# Patient Record
Sex: Male | Born: 1950 | Race: White | Hispanic: No | Marital: Single | Smoking: Never smoker
Health system: Southern US, Community
[De-identification: ages and names within clinical notes are randomized; demographics above are authoritative.]

## PROBLEM LIST (undated history)

## (undated) DIAGNOSIS — K219 Gastro-esophageal reflux disease without esophagitis: Secondary | ICD-10-CM

## (undated) DIAGNOSIS — S065X9A Traumatic subdural hemorrhage with loss of consciousness of unspecified duration, initial encounter: Secondary | ICD-10-CM

## (undated) DIAGNOSIS — S065XAA Traumatic subdural hemorrhage with loss of consciousness status unknown, initial encounter: Secondary | ICD-10-CM

## (undated) HISTORY — PX: HERNIA REPAIR: SHX51

---

## 1999-12-22 ENCOUNTER — Emergency Department (HOSPITAL_COMMUNITY): Admission: EM | Admit: 1999-12-22 | Discharge: 1999-12-22 | Payer: Self-pay | Admitting: Internal Medicine

## 2000-08-23 ENCOUNTER — Emergency Department (HOSPITAL_COMMUNITY): Admission: EM | Admit: 2000-08-23 | Discharge: 2000-08-23 | Payer: Self-pay | Admitting: Emergency Medicine

## 2000-10-05 ENCOUNTER — Ambulatory Visit (HOSPITAL_COMMUNITY): Admission: EM | Admit: 2000-10-05 | Discharge: 2000-10-06 | Payer: Self-pay | Admitting: Emergency Medicine

## 2000-10-17 ENCOUNTER — Ambulatory Visit (HOSPITAL_COMMUNITY): Admission: RE | Admit: 2000-10-17 | Discharge: 2000-10-17 | Payer: Self-pay | Admitting: Gastroenterology

## 2000-10-17 ENCOUNTER — Encounter: Payer: Self-pay | Admitting: Gastroenterology

## 2001-01-02 ENCOUNTER — Encounter: Payer: Self-pay | Admitting: Gastroenterology

## 2001-01-02 ENCOUNTER — Ambulatory Visit (HOSPITAL_COMMUNITY): Admission: RE | Admit: 2001-01-02 | Discharge: 2001-01-02 | Payer: Self-pay | Admitting: Gastroenterology

## 2001-03-06 ENCOUNTER — Ambulatory Visit (HOSPITAL_COMMUNITY): Admission: RE | Admit: 2001-03-06 | Discharge: 2001-03-06 | Payer: Self-pay | Admitting: Gastroenterology

## 2001-03-06 ENCOUNTER — Encounter: Payer: Self-pay | Admitting: Gastroenterology

## 2001-08-14 ENCOUNTER — Ambulatory Visit (HOSPITAL_COMMUNITY): Admission: RE | Admit: 2001-08-14 | Discharge: 2001-08-14 | Payer: Self-pay | Admitting: Gastroenterology

## 2004-08-21 ENCOUNTER — Encounter: Admission: RE | Admit: 2004-08-21 | Discharge: 2004-08-21 | Payer: Self-pay | Admitting: General Surgery

## 2004-08-22 ENCOUNTER — Ambulatory Visit (HOSPITAL_COMMUNITY): Admission: RE | Admit: 2004-08-22 | Discharge: 2004-08-22 | Payer: Self-pay | Admitting: General Surgery

## 2004-08-22 ENCOUNTER — Ambulatory Visit (HOSPITAL_BASED_OUTPATIENT_CLINIC_OR_DEPARTMENT_OTHER): Admission: RE | Admit: 2004-08-22 | Discharge: 2004-08-22 | Payer: Self-pay | Admitting: General Surgery

## 2015-06-06 ENCOUNTER — Encounter (HOSPITAL_BASED_OUTPATIENT_CLINIC_OR_DEPARTMENT_OTHER): Payer: Self-pay | Admitting: Emergency Medicine

## 2015-06-06 ENCOUNTER — Emergency Department (HOSPITAL_BASED_OUTPATIENT_CLINIC_OR_DEPARTMENT_OTHER)
Admission: EM | Admit: 2015-06-06 | Discharge: 2015-06-06 | Disposition: A | Discharge status: Home / Self Care | Payer: No Typology Code available for payment source | Attending: Emergency Medicine | Admitting: Emergency Medicine

## 2015-06-06 ENCOUNTER — Emergency Department (HOSPITAL_BASED_OUTPATIENT_CLINIC_OR_DEPARTMENT_OTHER): Payer: No Typology Code available for payment source

## 2015-06-06 ENCOUNTER — Other Ambulatory Visit: Payer: Self-pay

## 2015-06-06 DIAGNOSIS — Y9389 Activity, other specified: Secondary | ICD-10-CM | POA: Insufficient documentation

## 2015-06-06 DIAGNOSIS — R51 Headache: Secondary | ICD-10-CM | POA: Insufficient documentation

## 2015-06-06 DIAGNOSIS — Y998 Other external cause status: Secondary | ICD-10-CM | POA: Insufficient documentation

## 2015-06-06 DIAGNOSIS — W208XXA Other cause of strike by thrown, projected or falling object, initial encounter: Secondary | ICD-10-CM | POA: Diagnosis not present

## 2015-06-06 DIAGNOSIS — S60222A Contusion of left hand, initial encounter: Secondary | ICD-10-CM | POA: Diagnosis not present

## 2015-06-06 DIAGNOSIS — S6992XA Unspecified injury of left wrist, hand and finger(s), initial encounter: Secondary | ICD-10-CM | POA: Diagnosis present

## 2015-06-06 DIAGNOSIS — Z79899 Other long term (current) drug therapy: Secondary | ICD-10-CM | POA: Insufficient documentation

## 2015-06-06 DIAGNOSIS — Y9289 Other specified places as the place of occurrence of the external cause: Secondary | ICD-10-CM | POA: Insufficient documentation

## 2015-06-06 DIAGNOSIS — S065X9A Traumatic subdural hemorrhage with loss of consciousness of unspecified duration, initial encounter: Secondary | ICD-10-CM

## 2015-06-06 DIAGNOSIS — S065XAA Traumatic subdural hemorrhage with loss of consciousness status unknown, initial encounter: Secondary | ICD-10-CM

## 2015-06-06 DIAGNOSIS — R200 Anesthesia of skin: Secondary | ICD-10-CM | POA: Diagnosis not present

## 2015-06-06 DIAGNOSIS — K219 Gastro-esophageal reflux disease without esophagitis: Secondary | ICD-10-CM | POA: Insufficient documentation

## 2015-06-06 HISTORY — DX: Gastro-esophageal reflux disease without esophagitis: K21.9

## 2015-06-06 LAB — CBC WITH DIFFERENTIAL/PLATELET
Basophils Absolute: 0 10*3/uL (ref 0.0–0.1)
Basophils Relative: 0 %
EOS ABS: 0.3 10*3/uL (ref 0.0–0.7)
EOS PCT: 3 %
HCT: 43.8 % (ref 39.0–52.0)
Hemoglobin: 15.3 g/dL (ref 13.0–17.0)
LYMPHS ABS: 1.4 10*3/uL (ref 0.7–4.0)
LYMPHS PCT: 14 %
MCH: 31.2 pg (ref 26.0–34.0)
MCHC: 34.9 g/dL (ref 30.0–36.0)
MCV: 89.4 fL (ref 78.0–100.0)
MONO ABS: 0.9 10*3/uL (ref 0.1–1.0)
Monocytes Relative: 9 %
Neutro Abs: 7.2 10*3/uL (ref 1.7–7.7)
Neutrophils Relative %: 74 %
PLATELETS: 257 10*3/uL (ref 150–400)
RBC: 4.9 MIL/uL (ref 4.22–5.81)
RDW: 13.1 % (ref 11.5–15.5)
WBC: 9.8 10*3/uL (ref 4.0–10.5)

## 2015-06-06 LAB — BASIC METABOLIC PANEL
Anion gap: 8 (ref 5–15)
BUN: 16 mg/dL (ref 6–20)
CHLORIDE: 105 mmol/L (ref 101–111)
CO2: 25 mmol/L (ref 22–32)
CREATININE: 1.04 mg/dL (ref 0.61–1.24)
Calcium: 8.9 mg/dL (ref 8.9–10.3)
GFR calc Af Amer: >60 mL/min (ref >60)
GLUCOSE: 117 mg/dL — AB (ref 65–99)
POTASSIUM: 4 mmol/L (ref 3.5–5.1)
Sodium: 138 mmol/L (ref 135–145)

## 2015-06-06 LAB — TROPONIN I: Troponin I: <0.03 ng/mL (ref <0.031)

## 2015-06-06 LAB — PROTIME-INR
INR: 1 (ref 0.00–1.49)
Prothrombin Time: 13.4 seconds (ref 11.6–15.2)

## 2015-06-06 NOTE — ED Notes (Signed)
Pt resting.  Pt placed on cardiac monitor. Informed patient he will be transferred to [place].  Pt verbalized  understanding.  Had this RN speak with step daughter Earnest Bailey

## 2015-06-06 NOTE — ED Provider Notes (Signed)
CSN: [id number]     Arrival date & time 06/06/15  1650 History   First MD Initiated Contact with Patient 06/06/15 1659     Chief Complaint  Patient presents with  . Hand Problem     (Consider location/radiation/quality/duration/timing/severity/associated sxs/prior Treatment) HPI Comments: Pt comes in with c/o problems with grip strength in his left hand and numbness to the left hand. Denies any injury. States that he started with a right headache 1 week ago. Denies vision or speech changes. He states that he has dropped his coffee cup a couple of times in the last week. He states that he is having numbness in his left upper gums. Denies problems swallowing.  The history is provided by the patient. No language interpreter was used.    Past Medical History  Diagnosis Date  . Acid reflux    Past Surgical History  Procedure Laterality Date  . Hernia repair     History reviewed. No pertinent family history. Social History  Substance Use Topics  . Smoking status: Never Smoker   . Smokeless tobacco: None  . Alcohol Use: No     Comment: occ    Review of Systems  All other systems reviewed and are negative.     Allergies  Review of patient's allergies indicates no known allergies.  Home Medications   Prior to Admission medications   Medication Sig Start Date End Date Taking? Authorizing Provider  omeprazole (PRILOSEC) 40 MG capsule Take 40 mg by mouth daily.   Yes Historical Provider, MD   BP 150/83 mmHg  Pulse 74  Temp(Src) 98.9 F (37.2 C) (Oral)  Resp 18  Ht 5\' 11"  (1.803 m)  Wt 86.183 kg  BMI 26.51 kg/m2  SpO2 100% Physical Exam  Constitutional: He is oriented to person, place, and time. He appears well-developed and well-nourished.  Cardiovascular: Normal rate and regular rhythm.   Pulmonary/Chest: Effort normal and breath sounds normal.  Abdominal: Soft. Bowel sounds are normal. There is no tenderness.  Musculoskeletal: Normal range of motion.   Neurological: He is alert and oriented to person, place, and time. He exhibits normal muscle tone. Coordination normal.  Grip strength is equal bilaterally. Good sensation to bilateral upper extremities. negative  For pronator drift. normal finger to nose. No facial drop  Skin: Skin is warm and dry.  Psychiatric: He has a normal mood and affect.  Nursing note and vitals reviewed.   ED Course  Procedures (including critical care time) Labs Review Labs Reviewed  BASIC METABOLIC PANEL - Abnormal; Notable for the following:    Glucose, Bld 117 (*)    All other components within normal limits  CBC WITH DIFFERENTIAL/PLATELET  TROPONIN I  PROTIME-INR    Imaging Review Dg Chest 2 View  06/06/2015  CLINICAL DATA:  Headache for 2 weeks, left upper extremity numbness radiating to left hand for 2 days EXAM: CHEST  2 VIEW COMPARISON:  08/21/2004 FINDINGS: The heart size and mediastinal contours are within normal limits. Both lungs are clear. The visualized skeletal structures are unremarkable. IMPRESSION: No active cardiopulmonary disease. Electronically Signed   By: Skipper Cliche M.D.   On: 06/06/2015 18:05   Ct Head Wo Contrast  06/06/2015  CLINICAL DATA:  Numbness of the left hand. EXAM: CT HEAD WITHOUT CONTRAST TECHNIQUE: Contiguous axial images were obtained from the base of the skull through the vertex without intravenous contrast. COMPARISON:  None. FINDINGS: There is an extra-axial fluid collection along the right cerebral convexity primarily low density with  a thin rim of hyperdensity most concerning for a subdural hematoma. The collection measures 13 mm in thickness with a majority of the collection measuring CSF attenuation and the hyperdense area measuring approximately 5 mm in greatest thickness. There is no midline shift. There is mild effacement of the right cerebral sulci. There is no evidence of mass effect or midline shift. There is no evidence of a space-occupying lesion. There is  no evidence of a cortical-based area of acute infarction. The ventricles and sulci are appropriate for the patient's age. The basal cisterns are patent. Visualized portions of the orbits are unremarkable. The visualized portions of the paranasal sinuses and mastoid air cells are unremarkable. The osseous structures are unremarkable. IMPRESSION: 1. Extra-axial fluid collection along the right cerebral convexity primarily low density with a thin rim of hyperdensity along the right cerebral hemisphere most concerning for an acute on subacute subdural hematoma. Critical Value/emergent results were called by telephone at the time of interpretation on 06/06/2015 at 6:18 pm to Dr. Glendell Docker , who verbally acknowledged these results. Electronically Signed   By: Kathreen Devoid   On: 06/06/2015 18:19   I have personally reviewed and evaluated these images and lab results as part of my medical decision-making.   EKG Interpretation None      MDM   Final diagnoses:  Subdural hematoma (HCC)    Discussed findings with Dr. Kathyrn Sheriff with nuerosurgery and pt is to be seen by him in the morning. Discussed findings with pt and return precautions. Pt in agreement with plan. Pt is ambulatory without any problem    Glendell Docker, NP 06/06/15 [place], MD 06/06/15 [phone]

## 2015-06-06 NOTE — ED Notes (Signed)
Patient reports that he started to have some numbness to his left hand starting yesterday. The patient states that his left hand is numb and he is having trouble using his fingers. The patient reports that he has this intermittently when trying to use the clutch on his motorcycle

## 2015-06-06 NOTE — Discharge Instructions (Signed)
Take tylenol if you need pain medication. Don't take bc:if you develop worsening symptoms between now and the morning then you need to go to [place]. You can follow up at his office at 9 in the morning Subdural Hematoma A subdural hematoma is a collection of blood between the brain and its tough outermost membrane covering (the dura). Blood clots that form in this area push down on the brain and cause irritation. A subdural hematoma may cause parts of the brain to stop working and eventually cause death.  CAUSES A subdural hematoma is caused by bleeding from a ruptured blood vessel (hemorrhage). The bleeding results from trauma to the head, such as from a fall or motor vehicle accident. There are two types of subdural hemorrhages:  Acute. This type develops shortly after a serious blow to the head and causes blood to collect very quickly. If not diagnosed and treated promptly, severe brain injury or death can occur.  Chronic. This is when bleeding develops more slowly, over weeks or months. RISK FACTORS People at risk for subdural hematoma include older persons, infants, and alcoholics. SYMPTOMS An acute subdural hemorrhage develops over minutes to hours. Symptoms can include:  Temporary loss of consciousness.  Weakness of arms or legs on one side of the body.  Changes in vision or speech.  A severe headache.  Seizures.  Nausea and vomiting.  Increased sleepiness. A chronic subdural hemorrhage develops over weeks to months. Symptoms may develop slowly and produce less noticeable problems or changes. Symptoms include:  A mild headache.  A change in personality.  Loss of balance or difficulty walking.  Weakness, numbness, or tingling in the arms or legs.  Nausea or vomiting.  Memory loss.  Double vision.  Increased sleepiness. DIAGNOSIS Your health care provider will perform a thorough physical and neurological exam. A CT scan or MRI may also be done. If there is blood  on the scan, its color will help your health care provider determine how long the hemorrhage has been there. TREATMENT If the cause is an acute subdural hemorrhage, immediate treatment is needed. In many cases an emergency surgery is performed to drain accumulated blood or to remove the blood clot. Sometimes steroid or diuretic medicines or controlled breathing through a ventilator is needed to decrease pressure in the brain. This is especially true if there is any swelling of the brain. If the cause is a chronic subdural hemorrhage, treatment depends on a variety of factors. Sometimes no treatment is needed. If the subdural hematoma is small and causes minimal or no symptoms, you may be treated with bed rest, medicines, and observation. If the hemorrhage is large or if you have neurological symptoms, an emergency surgery is usually needed to remove the blood clot. People who develop a subdural hemorrhage are at risk of developing seizures, even after the subdural hematoma has been treated. You may be prescribed an anti-seizure (anticonvulsant) medicine for a year or longer. HOME CARE INSTRUCTIONS  Only take medicines as directed by your health care provider.  Rest if directed by your health care provider.  Keep all follow-up appointments with your health care provider.  If you play a contact sport such as football, hockey or soccer and you experienced a significant head injury, allow enough time for healing (up to 15 days) before you start playing again. A repeated injury that occurs during this fragile repair period is likely to result in hemorrhage. This is called the second impact syndrome. SEEK IMMEDIATE MEDICAL CARE IF:  You fall or experience minor trauma to your head and you are taking blood thinners. If you are on any blood thinners even a very small injury can cause a subdural hematoma. You should not hesitate to seek medical attention regardless of how minor you think your symptoms  are.  You experience a head injury and have:  Drowsiness or a decrease in alertness.  Confusion or forgetfulness.  Slurred speech.  Irrational or aggressive behavior.  Numbness or paralysis in any part of the body.  A feeling of being sick to your stomach (nauseous) or you throw up (vomit).  Difficulty walking or poor coordination.  Double vision.  Seizures.  A bleeding disorder.  A history of heavy alcohol use.  Clear fluid draining from your nose or ears.  Personality changes.  Difficulty thinking.  Worsening symptoms. MAKE SURE YOU:  Understand these instructions.  Will watch your condition.  Will get help right away if you are not doing well or get worse. [place] of Neurological Disorders and Stroke: MasterBoxes.it American Association of Neurological Surgeons: www.neurosurgerytoday.org American Academy of Neurology (AAN): http://keith.biz/ Brain Injury Association of America: www.biausa.org   This information is not intended to replace advice given to you by your health care provider. Make sure you discuss any questions you have with your health care provider.   Document Released: 05/11/2004 Document Revised: 04/14/2013 Document Reviewed: 12/25/2012 Elsevier Interactive Patient Education [employer].

## 2015-06-07 ENCOUNTER — Other Ambulatory Visit: Payer: Self-pay | Admitting: Neurosurgery

## 2015-06-08 ENCOUNTER — Encounter (HOSPITAL_COMMUNITY): Payer: Self-pay | Admitting: *Deleted

## 2015-06-09 ENCOUNTER — Encounter (HOSPITAL_COMMUNITY)
Admission: RE | Disposition: A | Discharge status: Home / Self Care | Payer: Self-pay | Source: Ambulatory Visit | Attending: Neurosurgery

## 2015-06-09 ENCOUNTER — Inpatient Hospital Stay (HOSPITAL_COMMUNITY)
Admission: RE | Admit: 2015-06-09 | Discharge: 2015-06-13 | DRG: 027 | Disposition: A | Discharge status: Home / Self Care | Payer: No Typology Code available for payment source | Source: Ambulatory Visit | Attending: Neurosurgery | Admitting: Neurosurgery

## 2015-06-09 ENCOUNTER — Inpatient Hospital Stay (HOSPITAL_COMMUNITY): Payer: No Typology Code available for payment source | Admitting: Anesthesiology

## 2015-06-09 ENCOUNTER — Encounter (HOSPITAL_COMMUNITY): Payer: Self-pay | Admitting: *Deleted

## 2015-06-09 DIAGNOSIS — I6203 Nontraumatic chronic subdural hemorrhage: Secondary | ICD-10-CM | POA: Diagnosis present

## 2015-06-09 DIAGNOSIS — K219 Gastro-esophageal reflux disease without esophagitis: Secondary | ICD-10-CM | POA: Diagnosis present

## 2015-06-09 DIAGNOSIS — Z79899 Other long term (current) drug therapy: Secondary | ICD-10-CM | POA: Diagnosis not present

## 2015-06-09 HISTORY — DX: Traumatic subdural hemorrhage with loss of consciousness status unknown, initial encounter: S06.5XAA

## 2015-06-09 HISTORY — PX: BURR HOLE: SHX908

## 2015-06-09 HISTORY — DX: Traumatic subdural hemorrhage with loss of consciousness of unspecified duration, initial encounter: S06.5X9A

## 2015-06-09 LAB — MRSA PCR SCREENING: MRSA BY PCR: NEGATIVE

## 2015-06-09 SURGERY — CREATION, CRANIAL BURR HOLE
Anesthesia: General | Laterality: Right

## 2015-06-09 MED ORDER — ROCURONIUM BROMIDE 50 MG/5ML IV SOLN
INTRAVENOUS | Status: AC
  Filled 2015-06-09: qty 1

## 2015-06-09 MED ORDER — ONDANSETRON HCL 4 MG PO TABS: 4.0000 mg | ORAL_TABLET | ORAL | Status: DC | PRN

## 2015-06-09 MED ORDER — ONDANSETRON HCL 4 MG/2ML IJ SOLN
INTRAMUSCULAR | Status: DC | PRN
  Administered 2015-06-09: 4 mg via INTRAVENOUS

## 2015-06-09 MED ORDER — PANTOPRAZOLE SODIUM 40 MG PO TBEC
80.0000 mg | DELAYED_RELEASE_TABLET | Freq: Every day | ORAL | Status: DC
  Administered 2015-06-10 – 2015-06-13 (×4): 80 mg via ORAL
  Filled 2015-06-09 (×4): qty 2

## 2015-06-09 MED ORDER — SUCCINYLCHOLINE CHLORIDE 20 MG/ML IJ SOLN
INTRAMUSCULAR | Status: AC
  Filled 2015-06-09: qty 1

## 2015-06-09 MED ORDER — SENNA 8.6 MG PO TABS
1.0000 | ORAL_TABLET | Freq: Two times a day (BID) | ORAL | Status: DC
Start: 2015-06-09 — End: 2015-06-13
  Administered 2015-06-09 – 2015-06-12 (×8): 8.6 mg via ORAL
  Filled 2015-06-09 (×8): qty 1

## 2015-06-09 MED ORDER — BUPIVACAINE HCL (PF) 0.5 % IJ SOLN
INTRAMUSCULAR | Status: DC | PRN
  Administered 2015-06-09: 5 mL

## 2015-06-09 MED ORDER — SUGAMMADEX SODIUM 200 MG/2ML IV SOLN
INTRAVENOUS | Status: DC | PRN
  Administered 2015-06-09: 200 mg via INTRAVENOUS

## 2015-06-09 MED ORDER — GELATIN ABSORBABLE MT POWD
OROMUCOSAL | Status: DC | PRN
  Administered 2015-06-09: 07:00:00 via TOPICAL

## 2015-06-09 MED ORDER — PHENYLEPHRINE 40 MCG/ML (10ML) SYRINGE FOR IV PUSH (FOR BLOOD PRESSURE SUPPORT)
PREFILLED_SYRINGE | INTRAVENOUS | Status: AC
  Filled 2015-06-09: qty 10

## 2015-06-09 MED ORDER — SODIUM CHLORIDE 0.9 % IV SOLN
INTRAVENOUS | Status: DC
  Administered 2015-06-09 (×2): via INTRAVENOUS

## 2015-06-09 MED ORDER — FENTANYL CITRATE (PF) 100 MCG/2ML IJ SOLN: 25.0000 ug | INTRAMUSCULAR | Status: DC | PRN

## 2015-06-09 MED ORDER — ADULT MULTIVITAMIN W/MINERALS CH
1.0000 | ORAL_TABLET | Freq: Every day | ORAL | Status: DC
  Administered 2015-06-10 – 2015-06-13 (×4): 1 via ORAL
  Filled 2015-06-09 (×4): qty 1

## 2015-06-09 MED ORDER — LIDOCAINE-EPINEPHRINE 1 %-1:100000 IJ SOLN
INTRAMUSCULAR | Status: DC | PRN
  Administered 2015-06-09: 5 mL

## 2015-06-09 MED ORDER — PROPOFOL 10 MG/ML IV BOLUS
INTRAVENOUS | Status: AC
  Filled 2015-06-09: qty 20

## 2015-06-09 MED ORDER — CEFAZOLIN SODIUM 1-5 GM-% IV SOLN
1.0000 g | Freq: Three times a day (TID) | INTRAVENOUS | Status: AC
  Administered 2015-06-09 (×2): 1 g via INTRAVENOUS
  Filled 2015-06-09 (×2): qty 50

## 2015-06-09 MED ORDER — PROMETHAZINE HCL 25 MG PO TABS: 12.5000 mg | ORAL_TABLET | ORAL | Status: DC | PRN

## 2015-06-09 MED ORDER — SUGAMMADEX SODIUM 200 MG/2ML IV SOLN
INTRAVENOUS | Status: AC
  Filled 2015-06-09: qty 2

## 2015-06-09 MED ORDER — ONDANSETRON HCL 4 MG/2ML IJ SOLN: 4.0000 mg | INTRAMUSCULAR | Status: DC | PRN

## 2015-06-09 MED ORDER — FENTANYL CITRATE (PF) 250 MCG/5ML IJ SOLN
INTRAMUSCULAR | Status: AC
  Filled 2015-06-09: qty 5

## 2015-06-09 MED ORDER — ONDANSETRON HCL 4 MG/2ML IJ SOLN
INTRAMUSCULAR | Status: AC
  Filled 2015-06-09: qty 2

## 2015-06-09 MED ORDER — LABETALOL HCL 5 MG/ML IV SOLN: 10.0000 mg | INTRAVENOUS | Status: DC | PRN

## 2015-06-09 MED ORDER — ROCURONIUM BROMIDE 100 MG/10ML IV SOLN
INTRAVENOUS | Status: DC | PRN
  Administered 2015-06-09: 50 mg via INTRAVENOUS

## 2015-06-09 MED ORDER — HYDROCODONE-ACETAMINOPHEN 5-325 MG PO TABS
1.0000 | ORAL_TABLET | ORAL | Status: DC | PRN
  Administered 2015-06-09: 1 via ORAL
  Filled 2015-06-09 (×2): qty 1

## 2015-06-09 MED ORDER — EPHEDRINE SULFATE 50 MG/ML IJ SOLN
INTRAMUSCULAR | Status: AC
  Filled 2015-06-09: qty 1

## 2015-06-09 MED ORDER — BISACODYL 10 MG RE SUPP: 10.0000 mg | Freq: Every day | RECTAL | Status: DC | PRN

## 2015-06-09 MED ORDER — HYDROCODONE-ACETAMINOPHEN 5-325 MG PO TABS
1.0000 | ORAL_TABLET | ORAL | Status: DC | PRN
  Administered 2015-06-09: 2 via ORAL
  Administered 2015-06-09 – 2015-06-12 (×11): 1 via ORAL
  Filled 2015-06-09 (×11): qty 1

## 2015-06-09 MED ORDER — FENTANYL CITRATE (PF) 100 MCG/2ML IJ SOLN
INTRAMUSCULAR | Status: DC | PRN
  Administered 2015-06-09: 150 ug via INTRAVENOUS

## 2015-06-09 MED ORDER — BACITRACIN ZINC 500 UNIT/GM EX OINT
TOPICAL_OINTMENT | CUTANEOUS | Status: DC | PRN
  Administered 2015-06-09: 1 via TOPICAL

## 2015-06-09 MED ORDER — MIDAZOLAM HCL 2 MG/2ML IJ SOLN
INTRAMUSCULAR | Status: AC
  Filled 2015-06-09: qty 2

## 2015-06-09 MED ORDER — SODIUM CHLORIDE 0.9 % IR SOLN
Status: DC | PRN
  Administered 2015-06-09: 07:00:00

## 2015-06-09 MED ORDER — LIDOCAINE HCL (CARDIAC) 20 MG/ML IV SOLN
INTRAVENOUS | Status: DC | PRN
  Administered 2015-06-09: 100 mg via INTRAVENOUS

## 2015-06-09 MED ORDER — SODIUM CHLORIDE 0.9 % IJ SOLN
INTRAMUSCULAR | Status: AC
  Filled 2015-06-09: qty 10

## 2015-06-09 MED ORDER — CEFAZOLIN SODIUM-DEXTROSE 2-3 GM-% IV SOLR
INTRAVENOUS | Status: DC | PRN
  Administered 2015-06-09: 2 g via INTRAVENOUS

## 2015-06-09 MED ORDER — MIDAZOLAM HCL 5 MG/5ML IJ SOLN
INTRAMUSCULAR | Status: DC | PRN
  Administered 2015-06-09: 2 mg via INTRAVENOUS

## 2015-06-09 MED ORDER — PROPOFOL 10 MG/ML IV BOLUS
INTRAVENOUS | Status: DC | PRN
  Administered 2015-06-09: 150 mg via INTRAVENOUS

## 2015-06-09 MED ORDER — THROMBIN 20000 UNITS EX SOLR
CUTANEOUS | Status: DC | PRN
  Administered 2015-06-09: 07:00:00 via TOPICAL

## 2015-06-09 MED ORDER — 0.9 % SODIUM CHLORIDE (POUR BTL) OPTIME
TOPICAL | Status: DC | PRN
  Administered 2015-06-09: 1000 mL

## 2015-06-09 MED ORDER — DOCUSATE SODIUM 100 MG PO CAPS
100.0000 mg | ORAL_CAPSULE | Freq: Two times a day (BID) | ORAL | Status: DC
  Administered 2015-06-09 – 2015-06-12 (×8): 100 mg via ORAL
  Filled 2015-06-09 (×8): qty 1

## 2015-06-09 MED ORDER — LEVETIRACETAM 500 MG PO TABS
500.0000 mg | ORAL_TABLET | Freq: Two times a day (BID) | ORAL | Status: DC
  Administered 2015-06-09 – 2015-06-13 (×9): 500 mg via ORAL
  Filled 2015-06-09 (×9): qty 1

## 2015-06-09 MED ORDER — LIDOCAINE HCL (CARDIAC) 20 MG/ML IV SOLN
INTRAVENOUS | Status: AC
  Filled 2015-06-09: qty 5

## 2015-06-09 MED ORDER — LACTATED RINGERS IV SOLN
INTRAVENOUS | Status: DC | PRN
  Administered 2015-06-09 (×2): via INTRAVENOUS

## 2015-06-09 MED ORDER — PANTOPRAZOLE SODIUM 40 MG PO TBEC: 80.0000 mg | DELAYED_RELEASE_TABLET | Freq: Every day | ORAL | Status: DC

## 2015-06-09 MED ORDER — HEMOSTATIC AGENTS (NO CHARGE) OPTIME
TOPICAL | Status: DC | PRN
  Administered 2015-06-09: 1 via TOPICAL

## 2015-06-09 MED ORDER — PROMETHAZINE HCL 25 MG/ML IJ SOLN: 6.2500 mg | INTRAMUSCULAR | Status: DC | PRN

## 2015-06-09 SURGICAL SUPPLY — 73 items
BATTERY IQ STERILE (MISCELLANEOUS) ×3 IMPLANT
BENZOIN TINCTURE PRP APPL 2/3 (GAUZE/BANDAGES/DRESSINGS) IMPLANT
BLADE CLIPPER SURG (BLADE) ×3 IMPLANT
BLADE ULTRA TIP 2M (BLADE) ×3 IMPLANT
BNDG GAUZE ELAST 4 BULKY (GAUZE/BANDAGES/DRESSINGS) IMPLANT
BRUSH SCRUB EZ 1% IODOPHOR (MISCELLANEOUS) ×3 IMPLANT
BUR ACORN 6.0 PRECISION (BURR) ×2 IMPLANT
BUR ACORN 6.0MM PRECISION (BURR) ×1
BUR ADDG 1.1 (BURR) IMPLANT
BUR ADDG 1.1MM (BURR)
BUR MATCHSTICK NEURO 3.0 LAGG (BURR) IMPLANT
CANISTER SUCT 3000ML PPV (MISCELLANEOUS) ×3 IMPLANT
CLIP TI MEDIUM 6 (CLIP) IMPLANT
DRAIN SNY WOU 7FLT (WOUND CARE) IMPLANT
DRAPE NEUROLOGICAL W/INCISE (DRAPES) ×3 IMPLANT
DRAPE SURG 17X23 STRL (DRAPES) IMPLANT
DRAPE WARM FLUID 44X44 (DRAPE) ×3 IMPLANT
DRSG TELFA 3X8 NADH (GAUZE/BANDAGES/DRESSINGS) ×3 IMPLANT
DURAPREP 6ML APPLICATOR 50/CS (WOUND CARE) ×3 IMPLANT
ELECT CAUTERY BLADE 6.4 (BLADE) ×3 IMPLANT
ELECT REM PT RETURN 9FT ADLT (ELECTROSURGICAL) ×3
ELECTRODE REM PT RTRN 9FT ADLT (ELECTROSURGICAL) ×1 IMPLANT
EVACUATOR 1/8 PVC DRAIN (DRAIN) IMPLANT
EVACUATOR SILICONE 100CC (DRAIN) IMPLANT
GAUZE SPONGE 4X4 12PLY STRL (GAUZE/BANDAGES/DRESSINGS) ×3 IMPLANT
GAUZE SPONGE 4X4 16PLY XRAY LF (GAUZE/BANDAGES/DRESSINGS) IMPLANT
GLOVE BIOGEL PI IND STRL 7.5 (GLOVE) ×1 IMPLANT
GLOVE BIOGEL PI INDICATOR 7.5 (GLOVE) ×2
GLOVE ECLIPSE 7.0 STRL STRAW (GLOVE) ×6 IMPLANT
GLOVE EXAM NITRILE LRG STRL (GLOVE) IMPLANT
GLOVE EXAM NITRILE MD LF STRL (GLOVE) IMPLANT
GLOVE EXAM NITRILE XL STR (GLOVE) IMPLANT
GLOVE EXAM NITRILE XS STR PU (GLOVE) IMPLANT
GOWN STRL REUS W/ TWL LRG LVL3 (GOWN DISPOSABLE) ×2 IMPLANT
GOWN STRL REUS W/ TWL XL LVL3 (GOWN DISPOSABLE) IMPLANT
GOWN STRL REUS W/TWL 2XL LVL3 (GOWN DISPOSABLE) IMPLANT
GOWN STRL REUS W/TWL LRG LVL3 (GOWN DISPOSABLE) ×4
GOWN STRL REUS W/TWL XL LVL3 (GOWN DISPOSABLE)
HEMOSTAT POWDER KIT SURGIFOAM (HEMOSTASIS) ×3 IMPLANT
HEMOSTAT SURGICEL 2X14 (HEMOSTASIS) ×3 IMPLANT
KIT BASIN OR (CUSTOM PROCEDURE TRAY) ×3 IMPLANT
KIT ROOM TURNOVER OR (KITS) ×3 IMPLANT
NEEDLE HYPO 25X1 1.5 SAFETY (NEEDLE) ×3 IMPLANT
NS IRRIG 1000ML POUR BTL (IV SOLUTION) ×3 IMPLANT
PACK CRANIOTOMY (CUSTOM PROCEDURE TRAY) ×3 IMPLANT
PATTIES SURGICAL .5 X.5 (GAUZE/BANDAGES/DRESSINGS) IMPLANT
PATTIES SURGICAL .5 X3 (DISPOSABLE) IMPLANT
PATTIES SURGICAL 1X1 (DISPOSABLE) IMPLANT
PLATE 1.5/0.5 13MM BURR HOLE (Plate) ×3 IMPLANT
PLATE 1.5/0.5 18.5MM BURR HOLE (Plate) ×3 IMPLANT
SCREW SELF DRILL HT 1.5/4MM (Screw) ×18 IMPLANT
SPONGE NEURO XRAY DETECT 1X3 (DISPOSABLE) IMPLANT
SPONGE SURGIFOAM ABS GEL 100 (HEMOSTASIS) ×3 IMPLANT
STAPLER VISISTAT 35W (STAPLE) ×3 IMPLANT
STOCKINETTE 6  STRL (DRAPES) ×2
STOCKINETTE 6 STRL (DRAPES) ×1 IMPLANT
SUT ETHILON 3 0 FSL (SUTURE) IMPLANT
SUT ETHILON 3 0 PS 1 (SUTURE) IMPLANT
SUT NURALON 4 0 TR CR/8 (SUTURE) ×9 IMPLANT
SUT PL GUT 3 0 FS 1 (SUTURE) IMPLANT
SUT STEEL 0 (SUTURE)
SUT STEEL 0 18XMFL TIE 17 (SUTURE) IMPLANT
SUT VIC AB 0 CT1 18XCR BRD8 (SUTURE) ×2 IMPLANT
SUT VIC AB 0 CT1 8-18 (SUTURE) ×4
SUT VIC AB 3-0 SH 8-18 (SUTURE) ×6 IMPLANT
SYR CONTROL 10ML LL (SYRINGE) ×6 IMPLANT
TOWEL OR 17X24 6PK STRL BLUE (TOWEL DISPOSABLE) ×3 IMPLANT
TOWEL OR 17X26 10 PK STRL BLUE (TOWEL DISPOSABLE) ×3 IMPLANT
TRAY FOLEY W/METER SILVER 14FR (SET/KITS/TRAYS/PACK) IMPLANT
TUBE CONNECTING 12'X1/4 (SUCTIONS) ×1
TUBE CONNECTING 12X1/4 (SUCTIONS) ×2 IMPLANT
UNDERPAD 30X30 INCONTINENT (UNDERPADS AND DIAPERS) ×3 IMPLANT
WATER STERILE IRR 1000ML POUR (IV SOLUTION) ×3 IMPLANT

## 2015-06-09 NOTE — Op Note (Signed)
PREOP DIAGNOSIS:  1. Chronic right subdural hematoma   POSTOP DIAGNOSIS: Same  PROCEDURE: 1. Right bur hole evacuation of subdural hematoma  SURGEON: Dr. Consuella Lose, MD  ASSISTANT: Dr. Cyndy Freeze, MD  ANESTHESIA: General Endotracheal  EBL: 50cc  SPECIMENS: none  DRAINS: None  COMPLICATIONS: none immediate  CONDITION: Hemodynamically stable to PACU  HISTORY: Jesus Fitzpatrick is a 64 y.o. male initially seen in the emergency department, and subsequent referred to my outpatient clinic for evaluation. He presented with about 2 or 3 weeks of headaches, and several days of progressively worsening left hand weakness and numbness. He was found on exam to have significant proprioceptive loss, as well as left hand weakness. 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 Treatment options were discussed, and he elected to proceed with surgical evacuation. The risks and benefits of the surgery were explained in detail to the patient and his daughter. After all questions were answered, informed consent was obtained and witnessed.  PROCEDURE IN DETAIL: After informed consent was obtained and witnessed, the patient was brought to the operating room. After induction of general anesthesia, the patient was positioned on the operative table in the supine position. All pressure points were meticulously padded. Skin incision was then marked out and prepped and draped in the usual sterile fashion.  After timeout was conducted, skin incision was infiltrated with local anesthetic with epinephrine. Incision was then made sharply in a sigmoid fashion, and carried through the galea. Raney clips were then applied for hemostasis. The periosteum was then incised, and elevated. Self-retaining retractor was then placed. 2 bur holes were then made, the dura was coagulated, incised, and chronic subdural fluid was immediately evacuated from both  locations. Irrigation was then passed from the anterior bur hole back towards the posterior burr hole. This was done continuously, until irrigation ran clear.  Trudee Kuster hole covers were then placed and secured with screws. The wound was then irrigated with copious amounts of normal saline irrigation. The galea was closed with interrupted 3-0 Vicryl stitches, the skin was closed with standard sterile surgical skin staples. Sterile dressing was then applied. The patient was then x-rayed, transferred to the stretcher, and taken to the post anesthesia care unit in stable hemodynamic condition. At the end of the case all sponge, needle, instrument, and cottonoid counts were correct.

## 2015-06-09 NOTE — Anesthesia Postprocedure Evaluation (Signed)
Anesthesia Post Note  Patient: Jesus Fitzpatrick  Procedure(s) Performed: Procedure(s) (LRB): RIGHT BURR HOLES FOR SUBDURAL HEMATOMA (Right)  Patient location during evaluation: PACU Anesthesia Type: General Level of consciousness: awake and alert Pain management: pain level controlled Vital Signs Assessment: post-procedure vital signs reviewed and stable Respiratory status: spontaneous breathing, nonlabored ventilation, respiratory function stable and patient connected to nasal cannula oxygen Cardiovascular status: blood pressure returned to baseline and stable Postop Assessment: no signs of nausea or vomiting Anesthetic complications: no    Last Vitals:  Filed Vitals:   06/09/15 0918 06/09/15 0924  BP: 141/82   Pulse: 79 83  Temp:  36.6 C  Resp: 19 13    Last Pain:  Filed Vitals:   06/09/15 0926  PainSc: 4                  Catalina Gravel

## 2015-06-09 NOTE — H&P (Signed)
CC:  No chief complaint on file.   HPI: Jesus Fitzpatrick is a 64 year old man I'm seeing for about 2 weeks of right-sided headaches. He denies any associated trauma around the incident. 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 He does not recall anything severe. The headaches have been manageable with BC powder. A few days ago, he noted significant difficulty and weakness with his left hand opting a visit to the emergency department. He says that his left hand and fingers do not feel right. He also has a hard time with fine movements of his left hand, he says especially trying to get his wallet out of his back pocket when he cannot see his hand. He has not had any left leg symptoms, difficulty walking, balance problems or falls. He has not had any changes in vision. He has not had any seizures. He is not on any anticoagulants or antiplatelet agents.   PMH: Past Medical History  Diagnosis Date  . Acid reflux   . Subdural hematoma (HCC)     PSH: Past Surgical History  Procedure Laterality Date  . Hernia repair      SH: Social History  Substance Use Topics  . Smoking status: Never Smoker   . Smokeless tobacco: None  . Alcohol Use: No    MEDS: Prior to Admission medications   Medication Sig Start Date End Date Taking? Authorizing Provider  Ascorbic Acid (VITAMIN C PO) Take 1 tablet by mouth daily.   Yes Historical Provider, MD  Cyanocobalamin (VITAMIN B12 PO) Take 1 tablet by mouth daily.   Yes Historical Provider, MD  Multiple Vitamin (MULTIVITAMIN WITH MINERALS) TABS tablet Take 1 tablet by mouth daily.   Yes Historical Provider, MD  Omega-3 Fatty Acids (FISH OIL PO) Take 1 capsule by mouth daily.   Yes Historical Provider, MD  omeprazole (PRILOSEC) 40 MG capsule Take 40 mg by mouth daily.   Yes Historical Provider, MD    ALLERGY: No Known Allergies  ROS: ROS  NEUROLOGIC EXAM: Awake, alert, oriented Memory and  concentration grossly intact Speech fluent, appropriate CN grossly intact Motor exam: Upper Extremities Deltoid Bicep Tricep Grip  Right 5/5 5/5 5/5 5/5  Left 5/5 5/5 5/5 4+/5   Lower Extremity IP Quad PF DF EHL  Right 5/5 5/5 5/5 5/5 5/5  Left 5/5 5/5 5/5 5/5 5/5   Sensation grossly intact to LT  [facility]: CT scan of the brain without contrast was reviewed. This demonstrates an iso-dense right posterior frontoparietal chronic subdural hematoma, with local mass effect. There is no significant midline shift. There is no hydrocephalus.   IMPRESSION: 64 year old man with approximately 1 cm right frontoparietal subdural hematoma with local mass effect likely causing left hand weakness and sensory loss  PLAN: We will plan on proceeding with bur hole evacuation of the right chronic subdural hematoma   I did review the treatment options with the patient and his daughter. The option for continued conservative treatment with expectant observation and serial radiologic surveillance was discussed. We also discussed the option of bur hole/craniotomy for evacuation. Risks of the surgery were reviewed including the risk of conversion to acute subdural hematoma requiring emergent evacuation, seizures, bleeding, infection, and hydrocephalus. The risk of persistence or worsening of his symptoms was also discussed. With an understanding of these risks, the patient prefers to proceed with surgical evacuation at this time. All questions were answered.

## 2015-06-09 NOTE — Anesthesia Procedure Notes (Signed)
Procedure Name: Intubation Date/Time: 06/09/2015 7:47 AM Performed by: Carney Living Pre-anesthesia Checklist: Patient identified, Emergency Drugs available, Suction available, Patient being monitored and Timeout performed Patient Re-evaluated:Patient Re-evaluated prior to inductionOxygen Delivery Method: Circle system utilized Preoxygenation: Pre-oxygenation with 100% oxygen Intubation Type: IV induction Ventilation: Mask ventilation without difficulty Laryngoscope Size: Mac and 4 Grade View: Grade I Tube type: Oral Tube size: 7.5 mm Number of attempts: 1 Airway Equipment and Method: Stylet Placement Confirmation: ETT inserted through vocal cords under direct vision,  positive ETCO2 and breath sounds checked- equal and bilateral Secured at: 22 cm Tube secured with: Tape Dental Injury: Teeth and Oropharynx as per pre-operative assessment

## 2015-06-09 NOTE — Anesthesia Preprocedure Evaluation (Addendum)
Anesthesia Evaluation  Patient identified by MRN, date of birth, ID band Patient awake    Reviewed: Allergy & Precautions, NPO status , Patient's Chart, lab work & pertinent test results  Airway Mallampati: II  TM Distance: <3 FB Neck ROM: Full    Dental  (+) Teeth Intact, Dental Advisory Given   Pulmonary neg pulmonary ROS,    Pulmonary exam normal breath sounds clear to auscultation       Cardiovascular Exercise Tolerance: Good (-) hypertension(-) angina(-) CAD and (-) Past MI negative cardio ROS Normal cardiovascular exam Rhythm:Regular Rate:Normal     Neuro/Psych Chronic subdural hematoma with left arm weakness negative psych ROS   GI/Hepatic Neg liver ROS, GERD  Medicated and Controlled,  Endo/Other  negative endocrine ROS  Renal/GU negative Renal ROS     Musculoskeletal negative musculoskeletal ROS (+)   Abdominal   Peds  Hematology negative hematology ROS (+)   Anesthesia Other Findings Day of surgery medications reviewed with the patient.  Reproductive/Obstetrics                          Anesthesia Physical Anesthesia Plan  ASA: II  Anesthesia Plan: General   Post-op Pain Management:    Induction: Intravenous  Airway Management Planned: Oral ETT  Additional Equipment:   Intra-op Plan:   Post-operative Plan: Extubation in OR  Informed Consent: I have reviewed the patients History and Physical, chart, labs and discussed the procedure including the risks, benefits and alternatives for the proposed anesthesia with the patient or authorized representative who has indicated his/her understanding and acceptance.   Dental advisory given  Plan Discussed with: CRNA, Anesthesiologist and Surgeon  Anesthesia Plan Comments: (Risks/benefits of general anesthesia discussed with patient including risk of damage to teeth, lips, gum, and tongue, nausea/vomiting, allergic reactions to  medications, and the possibility of heart attack, stroke and death.  All patient questions answered.  Patient wishes to proceed.)       Anesthesia Quick Evaluation

## 2015-06-09 NOTE — Transfer of Care (Signed)
Immediate Anesthesia Transfer of Care Note  Patient: Jesus Fitzpatrick  Procedure(s) Performed: Procedure(s): RIGHT BURR HOLES FOR SUBDURAL HEMATOMA (Right)  Patient Location: PACU  Anesthesia Type:General  Level of Consciousness: awake, alert , oriented and patient cooperative  Airway & Oxygen Therapy: Patient Spontanous Breathing and Patient connected to nasal cannula oxygen  Post-op Assessment: Report given to RN, Post -op Vital signs reviewed and stable and Patient moving all extremities X 4  Post vital signs: Reviewed and stable  Last Vitals:  Filed Vitals:   06/09/15 0626  BP: 168/83  Pulse: 80  Temp: 36.8 C  Resp: 16    Complications: No apparent anesthesia complications

## 2015-06-09 NOTE — Care Management Note (Signed)
Case Management Note  Patient Details  Name: Jesus Fitzpatrick MRN: [id number] Date of Birth: [birth year]  Subjective/Objective:    Pt admitted on 06/09/15 s/p burr holes procedure for evacuation of SDH.  PTA, pt independent of ADLs, has supportive family.                   Action/Plan: Will follow for discharge planning as pt progresses.    Expected Discharge Date:                  Expected Discharge Plan:  Home/Self Care  In-House Referral:     Discharge planning Services  CM Consult  Post Acute Care Choice:    Choice offered to:     DME Arranged:    DME Agency:     HH Arranged:    HH Agency:     Status of Service:  In process, will continue to follow  Medicare Important Message Given:    Date Medicare IM Given:    Medicare IM give by:    Date Additional Medicare IM Given:    Additional Medicare Important Message give by:     If discussed at [place] of Stay Meetings, dates discussed:    Additional Comments:  Reinaldo Raddle, RN, BSN  Trauma/Neuro ICU Case Manager [phone]

## 2015-06-10 NOTE — Progress Notes (Signed)
No acute events. Reports left hand coordination is improving. AVSS Awake, alert, oriented Full strength to confrontation, no drift Some proprioceptive deficit on the left Dressing with some dried blood but otherwise looks good Stable/improving Transfer to floor, ambulate

## 2015-06-11 NOTE — Progress Notes (Signed)
No acute events. Reports left hand coordination is about the same as yesterday AVSS Awake, alert, oriented Full strength to confrontation, no drift Some proprioceptive deficit on the left Dressing with some dried blood but otherwise looks good Stable Lives by himself, not ambulating very well PT/OT

## 2015-06-11 NOTE — Evaluation (Signed)
Physical Therapy Evaluation Patient Details Name: Jesus Fitzpatrick MRN: [id number] DOB: 1950/08/24 Today's Date: 06/11/2015   History of Present Illness    64 y.o. male initially seen in the emergency department, and subsequent referred to neurosurgeon for evaluation. He presented with about 2 or 3 weeks of headaches, and several days of progressively worsening left hand weakness and numbness. He was found on exam to have significant proprioceptive loss, as well as left hand weakness. 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 Treatment options were discussed, and he elected to proceed with surgical evacuation. Underwent right burr hole evac 06/09/2015.   Clinical Impression  Pt presents with moderate limitations to functional mobility related to motor control issues, decr sensation, decr balance ability, impacting safety with gait.  Noted L (dominant) hand impairments and speech production/word finding issues on exam. OT consult already ordered, recommend SLP for speech/lang/cognition evaluation as well, MD please order if you agree.   Recommend short bouts of walking halls with nursing assistance; will initiate PT in acute setting to address deficits, recommend OPPT for neurologic rehab at d/c, and no equipment needs at this time.  Expect to see steady progress over course of acute stay, and pt plans to d/c home with family. See below for details of exam findings.    Follow Up Recommendations Outpatient PT (for neurologic rehabilitation)    Equipment Recommendations  None recommended by PT (tbd with progress in hospitla)    Recommendations for Other Services       Precautions / Restrictions Precautions Precautions: Fall Precaution Comments: bed alarm, up with assist only      Mobility  Bed Mobility Overal bed mobility: Modified Independent             General bed mobility comments: slowed but able to move to/from EOB  and reposition self independently  Transfers Overall transfer level: Needs assistance Equipment used: None Transfers: Sit to/from Stand Sit to Stand: Supervision         General transfer comment: standby for safety, observed to/from bed and standard toilet, minimal use of hands, sights surface before sitting, lacks impulsivity, minimal use of momentum  Ambulation/Gait Ambulation/Gait assistance: Min guard Ambulation Distance (Feet): 125 Feet Assistive device: None Gait Pattern/deviations: Step-through pattern;Drifts right/left;Trunk flexed;Narrow base of support;Decreased stride length Gait velocity: 1.00 ft/sec Gait velocity interpretation: <1.8 ft/sec, indicative of risk for recurrent falls General Gait Details: generally slowed velocity, with occasional crossing over gait pattern but no LOB or instabilty overtly noted.  Pt reports hx plantar fasciitis which impairs foot flexiblity and comfort when walking; see DGI  Stairs            Wheelchair Mobility    Modified Rankin (Stroke Patients Only) Modified Rankin (Stroke Patients Only) Pre-Morbid Rankin Score: Slight disability Modified Rankin: Moderately severe disability     Balance Overall balance assessment: Needs assistance Sitting-balance support: No upper extremity supported;Feet supported Sitting balance-Leahy Scale: Fair     Standing balance support: No upper extremity supported Standing balance-Leahy Scale: Fair Standing balance comment: can stand and move without external support but is unsafe to leave unattended         Rhomberg - Eyes Opened: 15 Rhomberg - Eyes Closed: 5     Standardized Balance Assessment Standardized Balance Assessment : Dynamic Gait Index   Dynamic Gait Index Level Surface: Mild Impairment Change in Gait Speed: Normal Gait with Horizontal Head Turns: Mild Impairment Gait with Vertical Head Turns: Mild  Impairment Gait and Pivot Turn: Mild Impairment Step Over Obstacle:  Mild Impairment Step Around Obstacles: Normal Steps: Mild Impairment Total Score: 18       Pertinent Vitals/Pain Pain Assessment: 0-10 Pain Score: 4  Pain Location: surgical site at scalp Pain Intervention(s): Monitored during session;Repositioned (pt asking every 4 hours to manage pain)    Home Living Family/patient expects to be discharged to:: Private residence Living Arrangements: Alone Available Help at Discharge: Family;Available 24 hours/day Type of Home: Apartment Home Access: Stairs to enter   Entrance Stairs-Number of Steps: 10 Home Layout: One level Home Equipment: None Additional Comments: lives alone, plans to stay with family after d/c prior to return home with intermittent supervision    Prior Function Level of Independence: Independent               Hand Dominance   Dominant Hand: Left    Extremity/Trunk Assessment   Upper Extremity Assessment: LUE deficits/detail;Defer to OT evaluation       LUE Deficits / Details: numbness L hand, noting slightly weakned ROM wrist/hand/fingers and over time/when distracted tends to hold in flexion; uses R hand preferentially, but will use L with cues; loss of grip when distracted and/or forgets holding cup, needs cues to put cup down   Lower Extremity Assessment: Overall WFL for tasks assessed      Cervical / Trunk Assessment: Other exceptions  Communication   Communication: Expressive difficulties (primarily dysarthria, but with intermittent word finding dif)  Cognition Arousal/Alertness: Awake/alert Behavior During Therapy: WFL for tasks assessed/performed;Flat affect Overall Cognitive Status: Difficult to assess                      General Comments      Exercises        Assessment/Plan    PT Assessment Patient needs continued PT services  PT Diagnosis Difficulty walking   PT Problem List Pain;Impaired sensation;Decreased cognition;Decreased coordination;Decreased mobility;Decreased  balance;Decreased strength;Decreased range of motion  PT Treatment Interventions Patient/family education;Neuromuscular re-education;Balance training;Therapeutic exercise;Therapeutic activities;Functional mobility training;Stair training;Gait training;DME instruction   PT Goals (Current goals can be found in the Care Plan section) Acute Rehab PT Goals Patient Stated Goal: live alone independent PT Goal Formulation: With patient Time For Goal Achievement: 06/25/15 Potential to Achieve Goals: Good    Frequency Min 4X/week   Barriers to discharge   plans to stay with family at d/c at least initially    Co-evaluation               End of Session Equipment Utilized During Treatment: Gait belt Activity Tolerance: Patient tolerated treatment well Patient left: in bed;with call bell/phone within reach Nurse Communication: Mobility status;Precautions         Time: 1205-1232 PT Time Calculation (min) (ACUTE ONLY): 27 min   Charges:   PT Evaluation $Initial PT Evaluation Tier I: 1 Procedure PT Treatments $Therapeutic Activity: 8-22 mins   PT G Codes:        Herbie Drape 06/11/2015, 1:11 PM

## 2015-06-12 ENCOUNTER — Encounter (HOSPITAL_COMMUNITY): Payer: Self-pay | Admitting: Neurosurgery

## 2015-06-12 MED ORDER — HYDROCODONE-ACETAMINOPHEN 5-325 MG PO TABS: 1.0000 | ORAL_TABLET | ORAL | Status: DC | PRN

## 2015-06-12 MED ORDER — LEVETIRACETAM 500 MG PO TABS: 500.0000 mg | ORAL_TABLET | Freq: Two times a day (BID) | ORAL | Status: DC

## 2015-06-12 NOTE — Progress Notes (Signed)
No issues overnight. Pt has mild HA, different from his preop HA. Subjective improvement in L hand dexterity.  EXAM:  BP 127/73 mmHg  Pulse 69  Temp(Src) 98.6 F (37 C) (Oral)  Resp 20  Ht 5\' 11"  (1.803 m)  Wt 86.183 kg (190 lb)  BMI 26.51 kg/m2  SpO2 95%  Awake, alert, oriented  Speech fluent, appropriate  CN grossly intact  5/5 BUE/BLE  Wound c/d/i  IMPRESSION:  64 y.o. male POD# 3 s/p bur hole evac of R SDH, subjectively improving.  PLAN: - Will get SLP eval today - Plan on d/c home this pm with outpatient therapy

## 2015-06-12 NOTE — Evaluation (Signed)
Speech Language Pathology Evaluation Patient Details Name: Jesus Fitzpatrick MRN: [id number] DOB: [birth year] Today's Date: 06/12/2015 Time: [id number] SLP Time Calculation (min) (ACUTE ONLY): 16 min  Problem List:  Patient Active Problem List   Diagnosis Date Noted  . Chronic subdural hematoma ([place]) 06/09/2015   Past Medical History:  Past Medical History  Diagnosis Date  . Acid reflux   . Subdural hematoma [facility])    Past Surgical History:  Past Surgical History  Procedure Laterality Date  . Hernia repair    . Burr hole Right 06/09/2015    Procedure: RIGHT BURR HOLES FOR SUBDURAL HEMATOMA;  Surgeon: Consuella Lose, MD;  Location: [facility];  Service: Neurosurgery;  Laterality: Right;   HPI:  Jesus Fitzpatrick is a 64 year old man I'm seeing for about 2 weeks of right-sided headaches. He denies any associated trauma around the incident. 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 He does not recall anything severe. The headaches have been manageable with BC powder. A few days ago, he noted significant difficulty and weakness with his left hand opting a visit to the emergency department. He says that his left hand and fingers do not feel right. He also has a hard time with fine movements of his left hand, he says especially trying to get his wallet out of his back pocket when he cannot see his hand. He has not had any left leg symptoms, difficulty walking, balance problems or falls. He has not had any changes in vision. He has not had any seizures. He is not on any anticoagulants or antiplatelet agents.   CT head showing hyperdensity along the   Assessment / Plan / Recommendation Clinical Impression  Cognitive/linguistic and motor speech evaluation was completed.  The patient was oriented x 4.  He acheived a score of 24/30 on the MOCA-B.  Primary deficits noted for recall of 5 novel items with interference. The patient was only able to  independently recall 1/5.  He accurately recalled all 5 given semantic cue.  He was able to accurately name pictures, provide category names and name at least 10 items in a category.  Simple calculation skills and visuoperceptual skills appeared to be intact.  Motor speech deficits were not observed.  The patient's speech was clear and he was conversant at the conversational level.   The patient reported that the issues with memory were baseline and that he's developed compensatory stategies over the years using lists and calendars to compensate.  Acute ST needs were not identified.  The patient was encourage to follow up with his physician if he finds the memory issues to be impacting his daily function.  Thank you for the consult.      SLP Assessment  Patient does not need any further Speech Lanaguage Pathology Services    Follow Up Recommendations  None          SLP Evaluation Prior Functioning  Cognitive/Linguistic Baseline: Within functional limits Type of Home: Apartment   Cognition  Overall Cognitive Status: Within Functional Limits for tasks assessed Arousal/Alertness: Awake/alert Orientation Level: Oriented X4 Attention: Sustained Sustained Attention: Appears intact Memory: Impaired Memory Impairment: Decreased recall of new information (Pt struggled to recall 5 novel words given interference.  ) Awareness: Appears intact Problem Solving: Appears intact Safety/Judgment: Appears intact    Comprehension  Auditory Comprehension Overall Auditory Comprehension: Appears within functional limits for tasks assessed Yes/No Questions: Within Functional Limits Commands: Within Functional  Limits Conversation: Complex    Expression Expression Primary Mode of Expression: Verbal Verbal Expression Overall Verbal Expression: Appears within functional limits for tasks assessed Initiation: No impairment Automatic Speech: Name;Social Response Level of Generative/Spontaneous  Verbalization: Conversation Naming: No impairment Pragmatics: No impairment Non-Verbal Means of Communication: Not applicable Written Expression Dominant Hand: Left Written Expression: Within Functional Limits   Oral / Motor Oral Motor/Sensory Function Overall Oral Motor/Sensory Function: Within functional limits Motor Speech Overall Motor Speech: Appears within functional limits for tasks assessed Respiration: Within functional limits Phonation: Normal Resonance: Within functional limits Articulation: Within functional limitis Intelligibility: Intelligible Motor Planning: Witnin functional limits Motor Speech Errors: Not applicable   Shelly Flatten, MA, CCC-SLP Acute Rehab SLP [phone] Lamar Sprinkles 06/12/2015, 12:10 PM

## 2015-06-12 NOTE — Progress Notes (Signed)
Physical Therapy Treatment Patient Details Name: Jesus Fitzpatrick MRN: [id number] DOB: 06/18/1951 Today's Date: 06/12/2015    History of Present Illness s/p burr hole evacuation of R chronic subdural hematoma    PT Comments    Pt progressing towards physical therapy goals. Was able to perform transfers and ambulation with close supervision or occasional guarding for safety. Pt anticipates d/c home today. Continue to recommend outpatient neuro rehabilitation for further skilled therapy needs.   Follow Up Recommendations  Outpatient PT (Neuro rehab)     Equipment Recommendations  None recommended by PT    Recommendations for Other Services       Precautions / Restrictions Precautions Precautions: Fall Precaution Comments: bed alarm, up with assist only Restrictions Weight Bearing Restrictions: No    Mobility  Bed Mobility               General bed mobility comments: Pt in recliner upon PT arrival.   Transfers Overall transfer level: Needs assistance Equipment used: None Transfers: Sit to/from Stand Sit to Stand: Supervision         General transfer comment: standby for safety, observed to/from bed and standard toilet, minimal use of hands, sights surface before sitting, lacks impulsivity, minimal use of momentum  Ambulation/Gait Ambulation/Gait assistance: Min guard Ambulation Distance (Feet): 250 Feet Assistive device: None Gait Pattern/deviations: Step-through pattern;Decreased stride length;Staggering left;Staggering right Gait velocity: Decreased Gait velocity interpretation: Below normal speed for age/gender General Gait Details: Occasional unsteadiness with minor stagger to L or R. Close guard for safety but no assist required to recover.    Stairs Stairs: Yes Stairs assistance: Min guard Stair Management: No rails;Forwards;Alternating pattern Number of Stairs: 10 (5 stairs x2) General stair comments: Close guard for safety. No assist required.    Wheelchair Mobility    Modified Rankin (Stroke Patients Only) Modified Rankin (Stroke Patients Only) Pre-Morbid Rankin Score: Slight disability Modified Rankin: Moderately severe disability     Balance Overall balance assessment: Needs assistance Sitting-balance support: Feet supported;No upper extremity supported Sitting balance-Leahy Scale: Good     Standing balance support: No upper extremity supported;During functional activity Standing balance-Leahy Scale: Fair               High level balance activites: Side stepping;Braiding;Head turns High Level Balance Comments: Minor imbalances noted but no assist required.     Cognition Arousal/Alertness: Awake/alert Behavior During Therapy: WFL for tasks assessed/performed Overall Cognitive Status: Within Functional Limits for tasks assessed       Memory: Decreased short-term memory (at baseline, uses compensatory strategies)              Exercises      General Comments        Pertinent Vitals/Pain Pain Assessment: No/denies pain Faces Pain Scale: No hurt    Home Living Family/patient expects to be discharged to:: Private residence Living Arrangements: Alone Available Help at Discharge: Family;Available 24 hours/day Type of Home: Apartment Home Access: Stairs to enter   Home Layout: One level Home Equipment: Grab bars - tub/shower Additional Comments: lives alone, plans to stay with family after d/c prior to return home with intermittent supervision    Prior Function Level of Independence: Independent      Comments: works as a Warden/ranger (current goals can now be found in the care plan section) Acute Rehab PT Goals Patient Stated Goal: return to work PT Goal Formulation: With patient Time For Goal Achievement: 06/25/15 Potential to Achieve Goals: Good Progress towards PT goals:  Progressing toward goals    Frequency  Min 4X/week    PT Plan Current plan remains appropriate     Co-evaluation             End of Session Equipment Utilized During Treatment: Gait belt Activity Tolerance: Patient tolerated treatment well Patient left: in chair;with call bell/phone within reach     Time: [id number] PT Time Calculation (min) (ACUTE ONLY): 15 min  Charges:  $Gait Training: 8-22 mins                    G Codes:      Rolinda Roan June 30, 2015, 3:01 PM   Rolinda Roan, PT, DPT Acute Rehabilitation Services Pager: [phone]

## 2015-06-12 NOTE — Progress Notes (Signed)
Patient and family at bedside are expressing concern about being discharged in the evening as the lack of light would be difficult and dangerous for the patient to ambulate and get set up in. Daughter indicates that she would be available all day tomorrow to transport and intends to stay with until he is stable to be on own.

## 2015-06-12 NOTE — Evaluation (Signed)
Occupational Therapy Evaluation Patient Details Name: Jesus Fitzpatrick MRN: [id number] DOB: [birth year] Today's Date: 06/12/2015    History of Present Illness s/p burr hole evacuation of R chronic subdural hematoma   Clinical Impression   Pt was independent prior to admission and is currently performing mobility and self care at a supervision level.  He continues to have mild weakness and impaired sensation in L UE.  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    Follow Up Recommendations  Outpatient OT    Equipment Recommendations       Recommendations for Other Services       Precautions / Restrictions Precautions Precautions: Fall      Mobility Bed Mobility                  Transfers Overall transfer level:  (supervision) Equipment used: None   Sit to Stand: Supervision              Balance     Sitting balance-Leahy Scale: Good       Standing balance-Leahy Scale: Fair                              ADL Overall ADL's : Needs assistance/impaired Eating/Feeding: Independent;Sitting Eating/Feeding Details (indicate cue type and reason): uses L hand as lead Grooming: Wash/dry hands;Wash/dry face;Oral care;Standing;Supervision/safety   Upper Body Bathing: Supervision/ safety;Standing   Lower Body Bathing: Supervison/ safety;Sit to/from stand   Upper Body Dressing : Set up;Sitting   Lower Body Dressing: Supervision/safety;Sit to/from stand   Toilet Transfer: Supervision/safety;Ambulation   Toileting- Clothing Manipulation and Hygiene: Supervision/safety;Sit to/from stand       Functional mobility during ADLs: Supervision/safety       Vision     Perception     Praxis      Pertinent Vitals/Pain Pain Assessment: Faces Faces Pain Scale: No hurt     Hand Dominance Left   Extremity/Trunk Assessment Upper Extremity Assessment Upper Extremity Assessment: LUE deficits/detail LUE Deficits /  Details: 4/5 shoulder, 5/5 elbow, 4/5 forerarm/wrist/hand LUE Sensation: decreased light touch;decreased proprioception LUE Coordination: decreased fine motor;decreased gross motor   Lower Extremity Assessment Lower Extremity Assessment: Defer to PT evaluation       Communication Communication Communication: No difficulties   Cognition Arousal/Alertness: Awake/alert Behavior During Therapy: WFL for tasks assessed/performed Overall Cognitive Status: Within Functional Limits for tasks assessed       Memory: Decreased short-term memory (at baseline, uses compensatory strategies)             General Comments       Exercises       Shoulder Instructions      Home Living Family/patient expects to be discharged to:: Private residence Living Arrangements: Alone Available Help at Discharge: Family;Available 24 hours/day Type of Home: Apartment Home Access: Stairs to enter Entrance Stairs-Number of Steps: 10   Home Layout: One level     Bathroom Shower/Tub: Teacher, early years/pre: Standard     Home Equipment: Grab bars - tub/shower   Additional Comments: lives alone, plans to stay with family after d/c prior to return home with intermittent supervision      Prior Functioning/Environment Level of Independence: Independent        Comments: works as a Wellsite geologist Diagnosis: Generalized weakness   OT Problem List: Decreased strength;Decreased coordination;Impaired UE functional use;Impaired sensation   OT  Treatment/Interventions:      OT Goals(Current goals can be found in the care plan section) Acute Rehab OT Goals Patient Stated Goal: return to work OT Goal Formulation: With patient  OT Frequency:     Barriers to D/C:            Co-evaluation              End of Session Equipment Utilized During Treatment: Gait belt  Activity Tolerance: Patient tolerated treatment well Patient left: in chair;with call bell/phone within reach    Time: 1320-1338 OT Time Calculation (min): 18 min Charges:  OT General Charges $OT Visit: 1 Procedure OT Evaluation $Initial OT Evaluation Tier I: 1 Procedure G-Codes:    Malka So 06/12/2015, 1:55 PM [phone]

## 2015-06-12 NOTE — Care Management Note (Signed)
Case Management Note  Patient Details  Name: Jesus Fitzpatrick MRN: [id number] Date of Birth: 02-09-1951  Subjective/Objective:                    Action/Plan: Patient going to discharge home today with his daughter. Dr Kathyrn Sheriff placed order for outpatient PT and patient given the information and address for [facility] and is in agreement to attend. Patient without a PCP and was given the [facility] number to assist him in finding a PCP in his area that will take his insurance. Will update bedside RN.  Expected Discharge Date:                  Expected Discharge Plan:  Home/Self Care  In-House Referral:     Discharge planning Services  CM Consult  Post Acute Care Choice:    Choice offered to:     DME Arranged:    DME Agency:     HH Arranged:    [facility] Agency:     Status of Service:  Completed, signed off  Medicare Important Message Given:    Date Medicare IM Given:    Medicare IM give by:    Date Additional Medicare IM Given:    Additional Medicare Important Message give by:     If discussed at [place] of Stay Meetings, dates discussed:    Additional Comments:  Pollie Friar, RN 06/12/2015, 12:27 PM

## 2015-06-13 NOTE — Discharge Summary (Signed)
  Physician Discharge Summary  Patient ID: Jesus Fitzpatrick MRN: [id number] DOB/AGE: 1951/06/03 64 y.o.  Admit date: 06/09/2015 Discharge date: 06/13/2015  Admission Diagnoses: Right chronic subdural hematoma  Discharge Diagnoses: Same Active Problems:   Chronic subdural hematoma [facility])   Discharged Condition: Stable  Hospital Course:  Mrs. [name] is a 64 y.o. male electively admitted after evacuation of right subdural hematoma. He had an uneventful hospital course, and was steadily improving with left hand dexterity. He was discharged home instable condition with minimal HA.  Treatments: Surgery - Bur hole evacuation of right SDH  Discharge Exam: Blood pressure 130/74, pulse 81, temperature 97.9 F (36.6 C), temperature source Oral, resp. rate 19, height 5\' 11"  (1.803 m), weight 86.183 kg (190 lb), SpO2 96 %. Awake, alert, oriented Speech fluent, appropriate CN grossly intact 5/5 BUE/BLE Wound c/d/i   Disposition: 01-Home or Self Care  Discharge Instructions    Ambulatory referral to Physical Therapy    Complete by:  As directed   Iontophoresis - 4 mg/ml of dexamethasone:  No  T.E.N.S. Unit Evaluation and Dispense as Indicated:  No            Medication List    TAKE these medications        FISH OIL PO  Take 1 capsule by mouth daily.     HYDROcodone-acetaminophen 5-325 MG tablet  Commonly known as:  NORCO/VICODIN  Take 1-2 tablets by mouth every 4 (four) hours as needed for moderate pain.     levETIRAcetam 500 MG tablet  Commonly known as:  KEPPRA  Take 1 tablet (500 mg total) by mouth 2 (two) times daily.     multivitamin with minerals Tabs tablet  Take 1 tablet by mouth daily.     omeprazole 40 MG capsule  Commonly known as:  PRILOSEC  Take 40 mg by mouth daily.     VITAMIN B12 PO  Take 1 tablet by mouth daily.     VITAMIN C PO  Take 1 tablet by mouth daily.           Follow-up Information    Follow up with [facility], Olvin Rohr, C, MD In 2  weeks.   Specialty:  Neurosurgery   Contact information:   [address] [place] [address] [phone]       Follow up with [facility]   Contact information:   They will contact you to set up the appointment. If you have not heard from them by the end of the week you may contact them at: [phone]      Follow up with [facility], Conda Wannamaker, C, MD In 2 weeks.   Specialty:  Neurosurgery   Contact information:   [address] [place] [address] [phone]       Signed: Consuella Lose, Loletha Grayer 06/13/2015, 10:08 AM

## 2015-06-13 NOTE — Progress Notes (Signed)
Pt for discharge home today. Discharge orders received. Discharge instructions and 2  prescriptions given with verbalized understanding. Family at bedside to assist with discharge. Volunteer brought patient to lobby via wheelchair at 1133.  Transported to home by family member.

## 2015-06-20 ENCOUNTER — Ambulatory Visit: Payer: No Typology Code available for payment source | Attending: Neurosurgery | Admitting: Rehabilitation

## 2015-06-20 ENCOUNTER — Encounter: Payer: Self-pay | Admitting: Rehabilitation

## 2015-06-20 DIAGNOSIS — Z9889 Other specified postprocedural states: Secondary | ICD-10-CM | POA: Insufficient documentation

## 2015-06-20 DIAGNOSIS — Z8679 Personal history of other diseases of the circulatory system: Secondary | ICD-10-CM | POA: Diagnosis present

## 2015-06-20 NOTE — Therapy (Signed)
[facility] [address] [place], [place], [address] Phone: [phone]   Fax:  [phone]  Physical Therapy Evaluation  Patient Details  Name: Jesus Fitzpatrick MRN: [id number] Date of Birth: 25-Sep-1950 Referring Provider: Consuella Lose, MD  Encounter Date: 06/20/2015      PT End of Session - 06/20/15 1205    Visit Number 1   Number of Visits 1  eval only   Authorization Type [employer] 60.00 copay   PT Start Time 0800   PT Stop Time 0845   PT Time Calculation (min) 45 min   Activity Tolerance Patient tolerated treatment well   Behavior During Therapy [facility] for tasks assessed/performed      Past Medical History  Diagnosis Date  . Acid reflux   . Subdural hematoma [facility])     Past Surgical History  Procedure Laterality Date  . Hernia repair    . Burr hole Right 06/09/2015    Procedure: RIGHT BURR HOLES FOR SUBDURAL HEMATOMA;  Surgeon: Consuella Lose, MD;  Location: [place] NEURO ORS;  Service: Neurosurgery;  Laterality: Right;    There were no vitals filed for this visit.  Visit Diagnosis:  S/P subdural hematoma evacuation - Plan: PT plan of care cert/re-cert      Subjective Assessment - 06/20/15 0803    Subjective "I'm here because I was having some headaches and L hand numbness and weakness so I went to the outpatient doctor and they [facility] where I [facility] this [facility] on my head, but I feel that I am doing much better now."    Limitations --  driving   Currently in Pain? No/denies            [facility] PT Assessment - 06/20/15 0806    Assessment   Medical Diagnosis Chronic R subdural hematoma evacuation   Referring Provider Consuella Lose, MD   Onset Date/Surgical Date 06/09/15   Hand Dominance Left   Precautions   Precautions None   Restrictions   Weight Bearing Restrictions No   Balance Screen   Has the patient fallen in the past 6 months No   Has the patient had a decrease in activity  level because of a fear of falling?  No   Is the patient reluctant to leave their home because of a fear of falling?  No   Home Environment   Living Environment Private residence   Living Arrangements Alone   Available Help at Discharge Family;Available PRN/intermittently   Type of Home House   Home Access Stairs to enter   Entrance Stairs-Number of Steps 2   Entrance Stairs-Rails None   Home Layout One level   Home Equipment Grab bars - tub/shower   Prior Function   Level of Independence Independent   Vocation Full time employment   Patent attorney    Leisure likes to work in garage   Cognition   Overall Cognitive Status Within Functional Limits for tasks assessed  states that cognition is "delayed" somewhat   Sensation   Light Touch Impaired by gross assessment  slightly impaired at wrist level on L hand   Proprioception Appears Intact   Coordination   Gross Motor Movements are Fluid and Coordinated Yes   Fine Motor Movements are Fluid and Coordinated Yes   ROM / Strength   AROM / PROM / Strength Strength   Strength   Overall Strength Within functional limits for tasks performed   Transfers   Transfers Sit to  Stand;Stand to Sit   Sit to Stand 7: Independent   Stand to Sit 7: Independent   Ambulation/Gait   Ambulation/Gait Yes   Ambulation/Gait Assistance 7: Independent   Ambulation Distance (Feet) 345 Feet   Assistive device None   Gait Pattern Within Functional Limits   Ambulation Surface Level;Unlevel;Indoor;Outdoor;Paved;Gravel;Grass   Gait velocity 3.51 ft/sec   Stairs Yes   Stairs Assistance 7: Independent   Functional Gait  Assessment   Gait assessed  Yes   Gait Level Surface Walks 20 ft in less than 5.5 sec, no assistive devices, good speed, no evidence for imbalance, normal gait pattern, deviates no more than 6 in outside of the 12 in walkway width.   Change in Gait Speed Able to smoothly change walking speed without loss of balance or  gait deviation. Deviate no more than 6 in outside of the 12 in walkway width.   Gait with Horizontal Head Turns Performs head turns smoothly with no change in gait. Deviates no more than 6 in outside 12 in walkway width   Gait with Vertical Head Turns Performs head turns with no change in gait. Deviates no more than 6 in outside 12 in walkway width.   Gait and Pivot Turn Pivot turns safely within 3 sec and stops quickly with no loss of balance.   Step Over Obstacle Is able to step over one shoe box (4.5 in total height) without changing gait speed. No evidence of imbalance.   Gait with Narrow Base of Support Is able to ambulate for 10 steps heel to toe with no staggering.   Gait with Eyes Closed Walks 20 ft, no assistive devices, good speed, no evidence of imbalance, normal gait pattern, deviates no more than 6 in outside 12 in walkway width. Ambulates 20 ft in less than 7 sec.   Ambulating Backwards Walks 20 ft, uses assistive device, slower speed, mild gait deviations, deviates 6-10 in outside 12 in walkway width.   Steps Alternating feet, no rail.   Total Score 28                           PT Education - 06/20/15 1204    Education provided Yes   Education Details Provided education on evaluation findings, returning to work simulated tasks in home garage in order to better assess LUE functional deficits before requesting formal OT evaluation.    Person(s) Educated Patient   Methods Explanation   Comprehension Verbalized understanding                    Plan - 06/20/15 1206    Clinical Impression Statement Pt presents s/p chronic hematoma with bur hole evacuation on 06/09/15 with initial LUE weakness and numbness noted.  Pt with single area of L wrist numbness during session but felt that it was due to wearing watch.  Did not note any LE weakness, gait speed WFL at 3.51 ft/sec and FGA score of 28/30 indicative of no fall risk.   Pt also able to climb step ladder  during session to change light bulb (as this is task he would do at home) without any concerns of LOB.  Educated to return to simulated work activities in garage to better assess functional deficits of LUE and if he did have noted deficits to request OT eval at MD follow up on 12/21.  PT to sign off on pt at this time with no further needs noted.  PT Frequency One time visit   Consulted and Agree with Plan of Care Patient         Problem List Patient Active Problem List   Diagnosis Date Noted  . Chronic subdural hematoma ([place]) 06/09/2015    Cameron Sprang, PT, MPT [facility] [address] [place], [place], [address] Phone: [phone]   Fax:  [phone] 06/20/2015, 12:10 PM  Name: Jesus Fitzpatrick MRN: [id number] Date of Birth: 1951-07-06

## 2017-06-27 IMAGING — CT CT HEAD W/O CM
1 series · 15 of 30 positions shown, 19 images · non-contrast
Comparison: None.

CLINICAL DATA: Numbness of the left hand.

EXAM:
CT HEAD WITHOUT CONTRAST
TECHNIQUE: Contiguous axial images were obtained from the base of the skull
through the vertex without intravenous contrast.

[Series 2: head 4.8 h37s · axial · 0.44mm/px · z∈[-114,+26]mm · 15 of 32 slices shown, 19 images]
[im 2/32  brain]
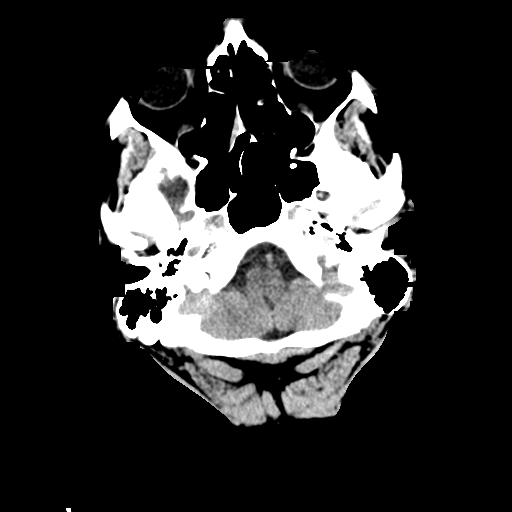
[im 2/32  bone]
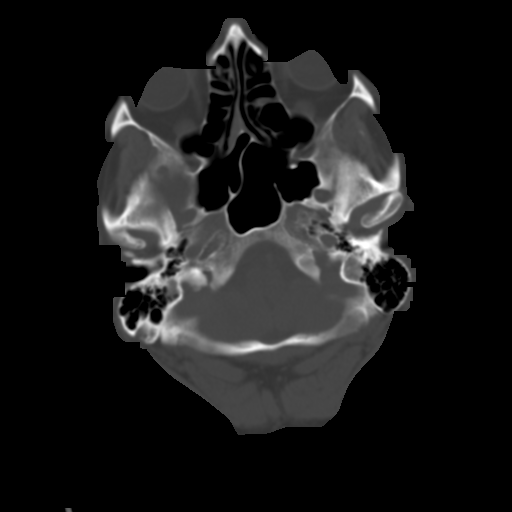
[im 4/32  brain]
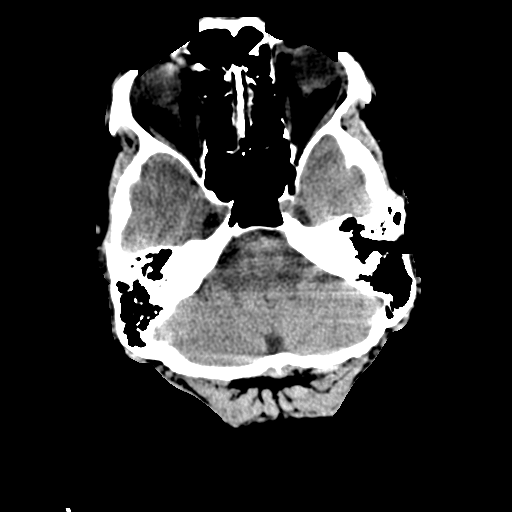
[im 6/32  brain]
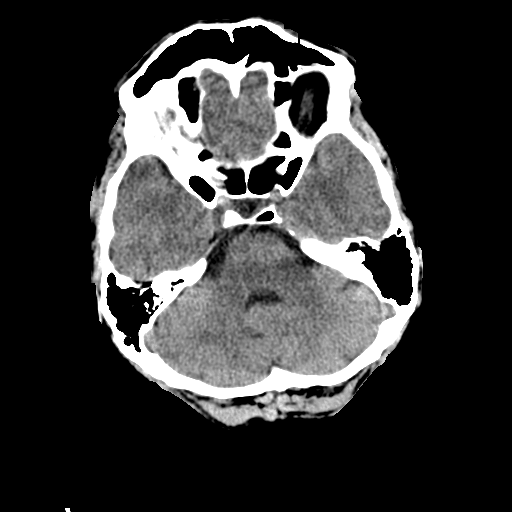
[im 8/32  brain]
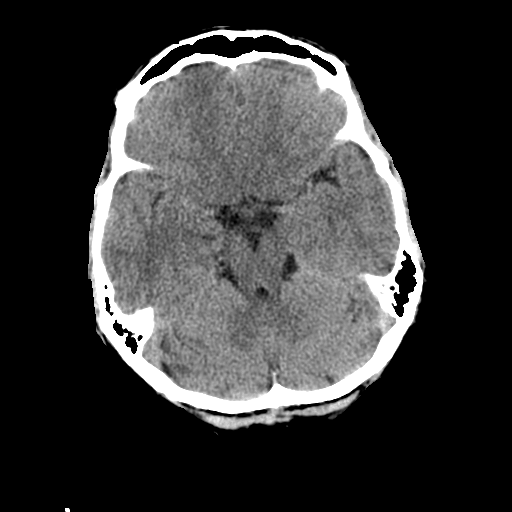
[im 10/32  brain]
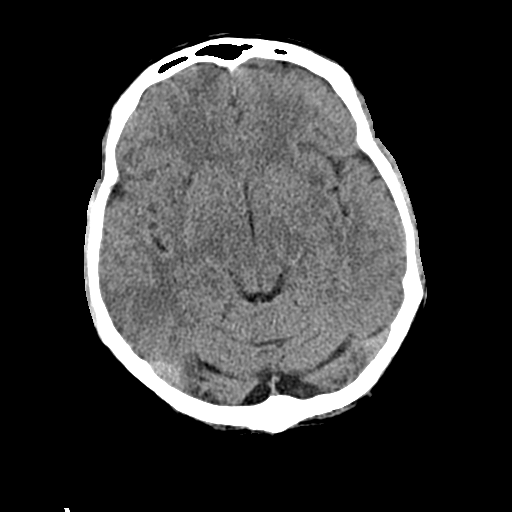
[im 10/32  bone]
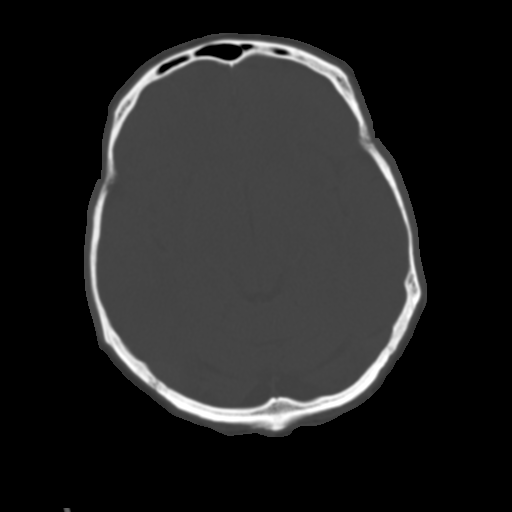
[im 12/32  brain]
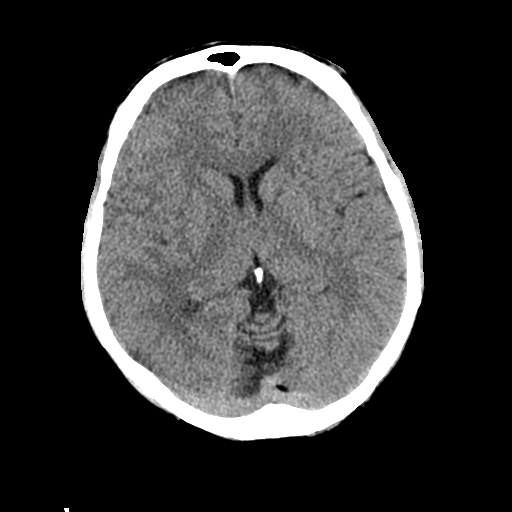
[im 14/32  brain]
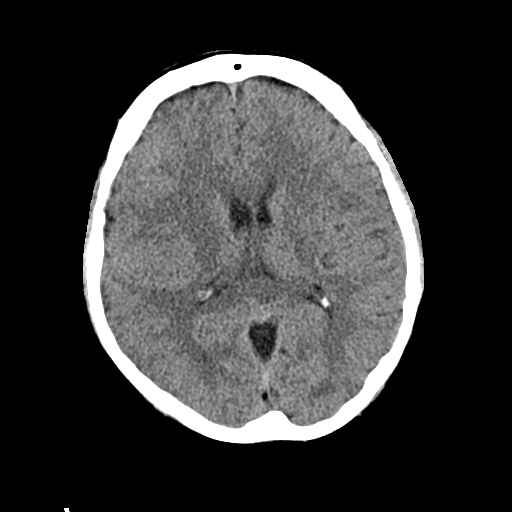
[im 17/32  brain]
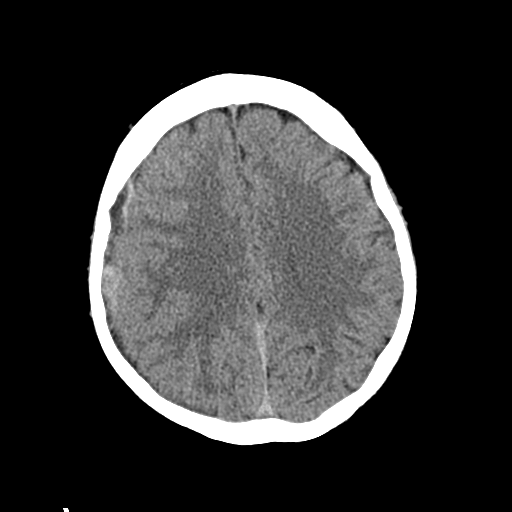
[im 18/32  brain]
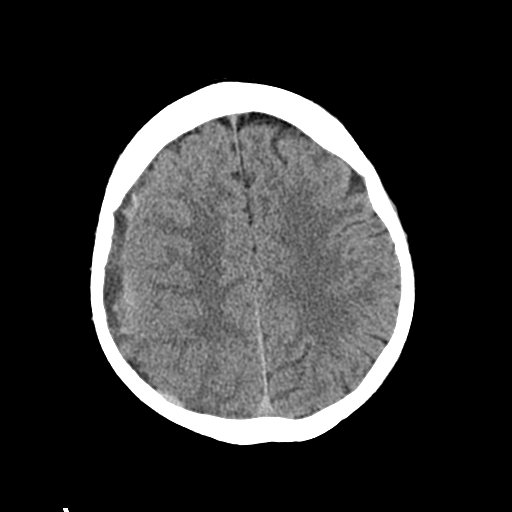
[im 18/32  bone]
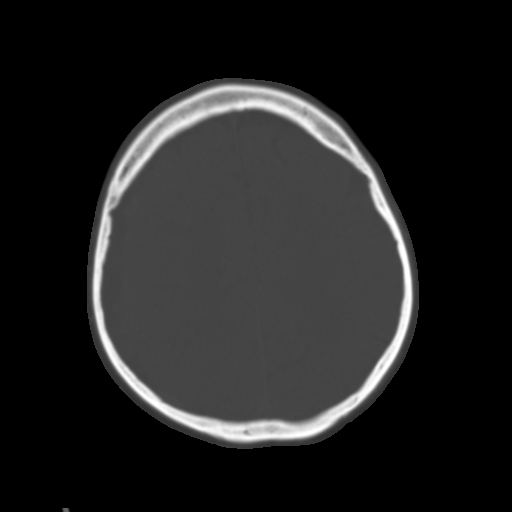
[im 20/32  brain]
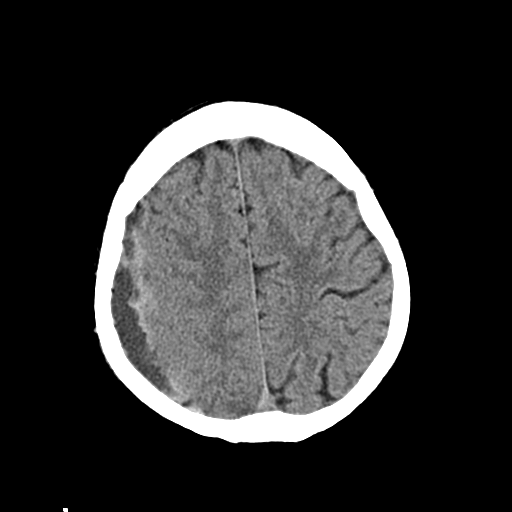
[im 22/32  brain]
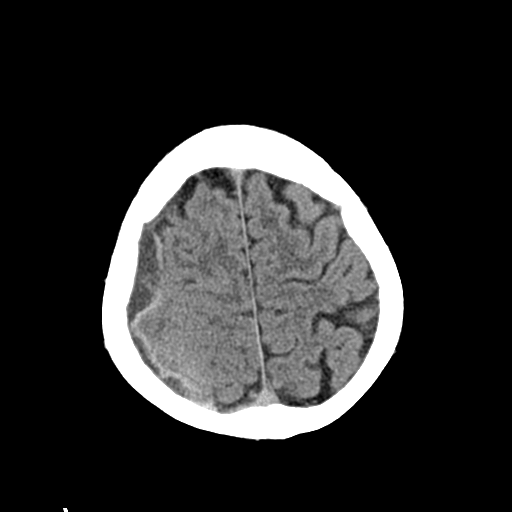
[im 24/32  brain]
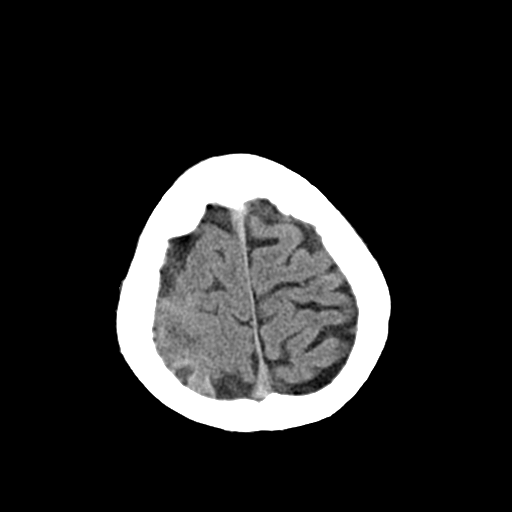
[im 26/32  brain]
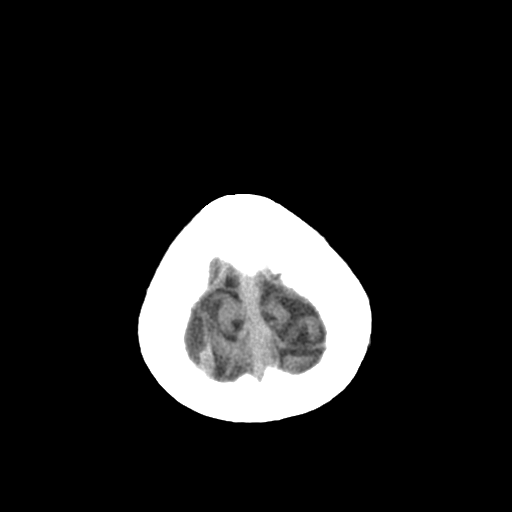
[im 26/32  bone]
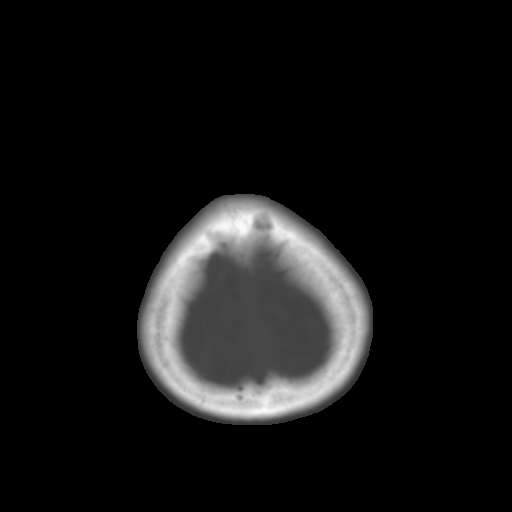
[im 28/32  brain]
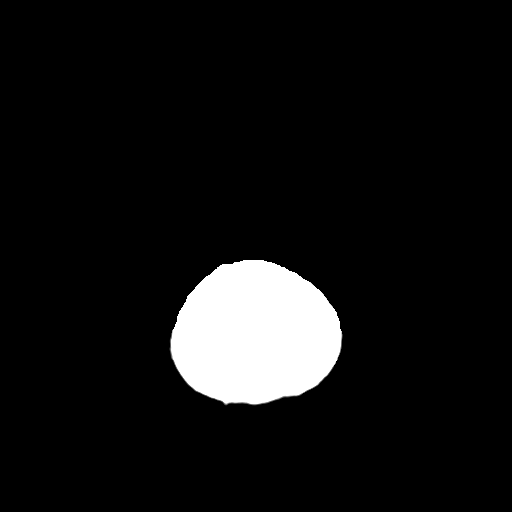
[im 30/32  brain]
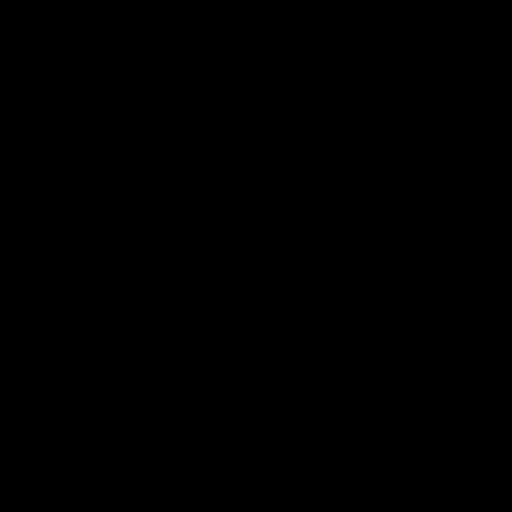

[15 of 30 positions shown; findings below may reference images not displayed]

FINDINGS: There is an extra-axial fluid collection along the right cerebral
convexity primarily low density with a thin rim of hyperdensity most
concerning for a subdural hematoma. The collection measures 13 mm in
thickness with a majority of the collection measuring CSF
attenuation and the hyperdense area measuring approximately 5 mm in
greatest thickness. There is no midline shift. There is mild
effacement of the right cerebral sulci. There is no evidence of mass
effect or midline shift. There is no evidence of a space-occupying
lesion. There is no evidence of a cortical-based area of acute
infarction.

The ventricles and sulci are appropriate for the patient's age. The
basal cisterns are patent.

Visualized portions of the orbits are unremarkable. The visualized
portions of the paranasal sinuses and mastoid air cells are
unremarkable.

The osseous structures are unremarkable.
IMPRESSION: 1. Extra-axial fluid collection along the right cerebral convexity
primarily low density with a thin rim of hyperdensity along the
right cerebral hemisphere most concerning for an acute on subacute
subdural hematoma.
Critical Value/emergent results were called by telephone at the time
of interpretation on 06/06/2015 at [DATE] to Dr. CLEDER NP ,
who verbally acknowledged these results.

## 2018-05-18 DIAGNOSIS — Z23 Encounter for immunization: Secondary | ICD-10-CM | POA: Diagnosis not present

## 2018-05-29 DIAGNOSIS — H00011 Hordeolum externum right upper eyelid: Secondary | ICD-10-CM | POA: Diagnosis not present

## 2018-06-16 DIAGNOSIS — H0011 Chalazion right upper eyelid: Secondary | ICD-10-CM | POA: Diagnosis not present

## 2018-11-27 ENCOUNTER — Emergency Department (HOSPITAL_BASED_OUTPATIENT_CLINIC_OR_DEPARTMENT_OTHER): Payer: Medicare Other

## 2018-11-27 ENCOUNTER — Encounter (HOSPITAL_BASED_OUTPATIENT_CLINIC_OR_DEPARTMENT_OTHER): Payer: Self-pay | Admitting: *Deleted

## 2018-11-27 ENCOUNTER — Other Ambulatory Visit: Payer: Self-pay

## 2018-11-27 ENCOUNTER — Inpatient Hospital Stay (HOSPITAL_BASED_OUTPATIENT_CLINIC_OR_DEPARTMENT_OTHER)
Admission: EM | Admit: 2018-11-27 | Discharge: 2018-11-29 | DRG: 638 | Disposition: A | Discharge status: Home / Self Care | Payer: Medicare Other | Attending: Internal Medicine | Admitting: Internal Medicine

## 2018-11-27 DIAGNOSIS — Z1159 Encounter for screening for other viral diseases: Secondary | ICD-10-CM | POA: Diagnosis not present

## 2018-11-27 DIAGNOSIS — R739 Hyperglycemia, unspecified: Secondary | ICD-10-CM | POA: Diagnosis present

## 2018-11-27 DIAGNOSIS — K219 Gastro-esophageal reflux disease without esophagitis: Secondary | ICD-10-CM | POA: Diagnosis present

## 2018-11-27 DIAGNOSIS — N179 Acute kidney failure, unspecified: Secondary | ICD-10-CM | POA: Diagnosis present

## 2018-11-27 DIAGNOSIS — E872 Acidosis: Secondary | ICD-10-CM | POA: Diagnosis not present

## 2018-11-27 DIAGNOSIS — R42 Dizziness and giddiness: Secondary | ICD-10-CM | POA: Diagnosis not present

## 2018-11-27 DIAGNOSIS — E785 Hyperlipidemia, unspecified: Secondary | ICD-10-CM | POA: Diagnosis present

## 2018-11-27 DIAGNOSIS — E1110 Type 2 diabetes mellitus with ketoacidosis without coma: Principal | ICD-10-CM | POA: Diagnosis present

## 2018-11-27 DIAGNOSIS — E871 Hypo-osmolality and hyponatremia: Secondary | ICD-10-CM | POA: Diagnosis present

## 2018-11-27 DIAGNOSIS — R000 Tachycardia, unspecified: Secondary | ICD-10-CM | POA: Diagnosis not present

## 2018-11-27 DIAGNOSIS — Z03818 Encounter for observation for suspected exposure to other biological agents ruled out: Secondary | ICD-10-CM | POA: Diagnosis not present

## 2018-11-27 DIAGNOSIS — E1165 Type 2 diabetes mellitus with hyperglycemia: Secondary | ICD-10-CM | POA: Diagnosis not present

## 2018-11-27 DIAGNOSIS — R030 Elevated blood-pressure reading, without diagnosis of hypertension: Secondary | ICD-10-CM

## 2018-11-27 DIAGNOSIS — E860 Dehydration: Secondary | ICD-10-CM | POA: Diagnosis present

## 2018-11-27 DIAGNOSIS — I10 Essential (primary) hypertension: Secondary | ICD-10-CM | POA: Diagnosis present

## 2018-11-27 LAB — CBC WITH DIFFERENTIAL/PLATELET
Abs Immature Granulocytes: 0.04 10*3/uL (ref 0.00–0.07)
Basophils Absolute: 0.1 10*3/uL (ref 0.0–0.1)
Basophils Relative: 1 %
Eosinophils Absolute: 0.2 10*3/uL (ref 0.0–0.5)
Eosinophils Relative: 2 %
HCT: 49.5 % (ref 39.0–52.0)
Hemoglobin: 16.7 g/dL (ref 13.0–17.0)
Immature Granulocytes: 1 %
Lymphocytes Relative: 11 %
Lymphs Abs: 0.9 10*3/uL (ref 0.7–4.0)
MCH: 31.1 pg (ref 26.0–34.0)
MCHC: 33.7 g/dL (ref 30.0–36.0)
MCV: 92.2 fL (ref 80.0–100.0)
Monocytes Absolute: 0.6 10*3/uL (ref 0.1–1.0)
Monocytes Relative: 7 %
Neutro Abs: 6.8 10*3/uL (ref 1.7–7.7)
Neutrophils Relative %: 78 %
Platelets: 237 10*3/uL (ref 150–400)
RBC: 5.37 MIL/uL (ref 4.22–5.81)
RDW: 12.3 % (ref 11.5–15.5)
WBC: 8.5 10*3/uL (ref 4.0–10.5)
nRBC: 0 % (ref 0.0–0.2)

## 2018-11-27 LAB — POCT I-STAT EG7
Acid-base deficit: 2 mmol/L (ref 0.0–2.0)
Bicarbonate: 22.7 mmol/L (ref 20.0–28.0)
Calcium, Ion: 1.13 mmol/L — ABNORMAL LOW (ref 1.15–1.40)
HCT: 49 % (ref 39.0–52.0)
Hemoglobin: 16.7 g/dL (ref 13.0–17.0)
O2 Saturation: 98 %
Potassium: 4.8 mmol/L (ref 3.5–5.1)
Sodium: 133 mmol/L — ABNORMAL LOW (ref 135–145)
TCO2: 24 mmol/L (ref 22–32)
pCO2, Ven: 36.8 mmHg — ABNORMAL LOW (ref 44.0–60.0)
pH, Ven: 7.398 (ref 7.250–7.430)
pO2, Ven: 96 mmHg — ABNORMAL HIGH (ref 32.0–45.0)

## 2018-11-27 LAB — SARS CORONAVIRUS 2 AG (30 MIN TAT): SARS Coronavirus 2 Ag: NEGATIVE

## 2018-11-27 LAB — GLUCOSE, CAPILLARY
Glucose-Capillary: 333 mg/dL — ABNORMAL HIGH (ref 70–99)
Glucose-Capillary: 250 mg/dL — ABNORMAL HIGH (ref 70–99)

## 2018-11-27 LAB — CBG MONITORING, ED
Glucose-Capillary: >600 mg/dL (ref 70–99)
Glucose-Capillary: >600 mg/dL (ref 70–99)
Glucose-Capillary: 482 mg/dL — ABNORMAL HIGH (ref 70–99)
Glucose-Capillary: 427 mg/dL — ABNORMAL HIGH (ref 70–99)

## 2018-11-27 LAB — URINALYSIS, ROUTINE W REFLEX MICROSCOPIC
Bilirubin Urine: NEGATIVE
Glucose, UA: >=500 mg/dL — AB
Hgb urine dipstick: NEGATIVE
Ketones, ur: 40 mg/dL — AB
Leukocytes,Ua: NEGATIVE
Nitrite: NEGATIVE
Protein, ur: NEGATIVE mg/dL
Specific Gravity, Urine: 1.01 (ref 1.005–1.030)
pH: 5.5 (ref 5.0–8.0)

## 2018-11-27 LAB — COMPREHENSIVE METABOLIC PANEL
ALT: 19 U/L (ref 0–44)
AST: 14 U/L — ABNORMAL LOW (ref 15–41)
Albumin: 4.5 g/dL (ref 3.5–5.0)
Alkaline Phosphatase: 152 U/L — ABNORMAL HIGH (ref 38–126)
Anion gap: 15 (ref 5–15)
BUN: 24 mg/dL — ABNORMAL HIGH (ref 8–23)
CO2: 19 mmol/L — ABNORMAL LOW (ref 22–32)
Calcium: 9 mg/dL (ref 8.9–10.3)
Chloride: 97 mmol/L — ABNORMAL LOW (ref 98–111)
Creatinine, Ser: 1.3 mg/dL — ABNORMAL HIGH (ref 0.61–1.24)
GFR calc Af Amer: >60 mL/min (ref >60)
GFR calc non Af Amer: 57 mL/min — ABNORMAL LOW (ref >60)
Glucose, Bld: 811 mg/dL (ref 70–99)
Potassium: 4.7 mmol/L (ref 3.5–5.1)
Sodium: 131 mmol/L — ABNORMAL LOW (ref 135–145)
Total Bilirubin: 1 mg/dL (ref 0.3–1.2)
Total Protein: 7.1 g/dL (ref 6.5–8.1)

## 2018-11-27 LAB — URINALYSIS, MICROSCOPIC (REFLEX)
RBC / HPF: NONE SEEN RBC/hpf (ref 0–5)
Squamous Epithelial / LPF: NONE SEEN (ref 0–5)

## 2018-11-27 MED ORDER — SODIUM CHLORIDE 0.9 % IV BOLUS
1000.0000 mL | Freq: Once | INTRAVENOUS | Status: AC
  Administered 2018-11-27: 17:00:00 1000 mL via INTRAVENOUS

## 2018-11-27 MED ORDER — POTASSIUM CHLORIDE 10 MEQ/100ML IV SOLN
10.0000 meq | INTRAVENOUS | Status: AC
  Administered 2018-11-27 – 2018-11-28 (×2): 10 meq via INTRAVENOUS
  Filled 2018-11-27: qty 100

## 2018-11-27 MED ORDER — DEXTROSE-NACL 5-0.45 % IV SOLN: INTRAVENOUS | Status: DC

## 2018-11-27 MED ORDER — SODIUM CHLORIDE 0.9 % IV BOLUS
1000.0000 mL | Freq: Once | INTRAVENOUS | Status: AC
  Administered 2018-11-27: 1000 mL via INTRAVENOUS

## 2018-11-27 MED ORDER — POTASSIUM CHLORIDE 10 MEQ/100ML IV SOLN
10.0000 meq | INTRAVENOUS | Status: AC
  Administered 2018-11-27 (×2): 10 meq via INTRAVENOUS
  Filled 2018-11-27 (×2): qty 100

## 2018-11-27 MED ORDER — ENOXAPARIN SODIUM 40 MG/0.4ML ~~LOC~~ SOLN
40.0000 mg | Freq: Every day | SUBCUTANEOUS | Status: DC
  Administered 2018-11-27 – 2018-11-28 (×2): 40 mg via SUBCUTANEOUS
  Filled 2018-11-27 (×2): qty 0.4

## 2018-11-27 MED ORDER — SODIUM CHLORIDE 0.9 % IV SOLN: INTRAVENOUS | Status: DC

## 2018-11-27 MED ORDER — INSULIN REGULAR(HUMAN) IN NACL 100-0.9 UT/100ML-% IV SOLN: INTRAVENOUS | Status: DC

## 2018-11-27 MED ORDER — DEXTROSE-NACL 5-0.45 % IV SOLN
INTRAVENOUS | Status: DC
  Administered 2018-11-27: 23:00:00 via INTRAVENOUS

## 2018-11-27 MED ORDER — INSULIN REGULAR(HUMAN) IN NACL 100-0.9 UT/100ML-% IV SOLN
INTRAVENOUS | Status: DC
  Administered 2018-11-27 (×2): 5.4 [IU]/h via INTRAVENOUS

## 2018-11-27 MED ORDER — INSULIN REGULAR(HUMAN) IN NACL 100-0.9 UT/100ML-% IV SOLN
INTRAVENOUS | Status: AC
  Filled 2018-11-27: qty 100

## 2018-11-27 NOTE — ED Notes (Signed)
Attempted to call report to [facility] , change in bed assignment needed for step down bed

## 2018-11-27 NOTE — ED Notes (Signed)
Report to [facility].  

## 2018-11-27 NOTE — ED Triage Notes (Addendum)
Pt c/o dry mouth , blurred vision and urinary freg x 1 week, after putting chemicals on lawn , weight loss . CBG in triage 600

## 2018-11-27 NOTE — ED Provider Notes (Signed)
[facility] EMERGENCY DEPARTMENT Provider Note   CSN: [id number] Arrival date & time: 11/27/18  1649    History   Chief Complaint Chief Complaint  Patient presents with  . Ingestion    HPI Jesus Fitzpatrick is a 68 y.o. male.     The history is provided by the patient and medical records. No language interpreter was used.  Hyperglycemia  Blood sugar level PTA:  >600 Severity:  Severe Onset quality:  Gradual Duration:  1 week Timing:  Constant Progression:  Unchanged Chronicity:  New Diabetes status:  Non-diabetic Context: not change in medication   Relieved by:  Nothing Ineffective treatments:  None tried Associated symptoms: blurred vision, dehydration, fatigue, increased thirst, nausea and polyuria   Associated symptoms: no abdominal pain, no chest pain, no confusion, no diaphoresis, no dizziness, no dysuria, no fever, no malaise, no shortness of breath, no syncope and no vomiting   Risk factors: no hx of DKA     Past Medical History:  Diagnosis Date  . Acid reflux   . Subdural hematoma [facility])     Patient Active Problem List   Diagnosis Date Noted  . Chronic subdural hematoma ([place]) 06/09/2015    Past Surgical History:  Procedure Laterality Date  . BURR HOLE Right 06/09/2015   Procedure: RIGHT BURR HOLES FOR SUBDURAL HEMATOMA;  Surgeon: Consuella Lose, MD;  Location: [place] NEURO ORS;  Service: Neurosurgery;  Laterality: Right;  . HERNIA REPAIR          Home Medications    Prior to Admission medications   Medication Sig Start Date End Date Taking? Authorizing Provider  Ascorbic Acid (VITAMIN C PO) Take 1 tablet by mouth daily.    [provider]  Cyanocobalamin (VITAMIN B12 PO) Take 1 tablet by mouth daily.    [provider]  HYDROcodone-acetaminophen (NORCO/VICODIN) 5-325 MG tablet Take 1-2 tablets by mouth every 4 (four) hours as needed for moderate pain. 06/12/15   Consuella Lose, MD  levETIRAcetam (KEPPRA) 500 MG tablet  Take 1 tablet (500 mg total) by mouth 2 (two) times daily. 06/12/15   Consuella Lose, MD  Multiple Vitamin (MULTIVITAMIN WITH MINERALS) TABS tablet Take 1 tablet by mouth daily.    [provider]  Omega-3 Fatty Acids (FISH OIL PO) Take 1 capsule by mouth daily.    [provider]  omeprazole (PRILOSEC) 40 MG capsule Take 40 mg by mouth daily.    [provider]    Family History No family history on file.  Social History Social History   Tobacco Use  . Smoking status: Never Smoker  Substance Use Topics  . Alcohol use: No  . Drug use: No     Allergies   Patient has no known allergies.   Review of Systems Review of Systems  Constitutional: Positive for fatigue. Negative for chills, diaphoresis and fever.  HENT: Negative for congestion.   Eyes: Positive for blurred vision.  Respiratory: Negative for cough, chest tightness and shortness of breath.   Cardiovascular: Negative for chest pain, palpitations, leg swelling and syncope.  Gastrointestinal: Positive for constipation and nausea. Negative for abdominal pain, diarrhea and vomiting.  Endocrine: Positive for polydipsia and polyuria. Negative for polyphagia.  Genitourinary: Positive for frequency. Negative for dysuria.  Musculoskeletal: Negative for back pain, neck pain and neck stiffness.  Skin: Negative for rash and wound.  Neurological: Negative for dizziness, seizures, light-headedness and headaches.  Psychiatric/Behavioral: Negative for agitation and confusion.  All other systems reviewed and are negative.  Physical Exam Updated Vital Signs Pulse (!) 109   Temp 98.3 F (36.8 C) (Oral)   Resp 18   Ht 5\' 11"  (1.803 m)   Wt 74.8 kg   SpO2 98%   BMI 23.01 kg/m   Physical Exam Vitals signs and nursing note reviewed.  Constitutional:      General: He is not in acute distress.    Appearance: He is well-developed. He is not ill-appearing or diaphoretic.  HENT:     Head:  Normocephalic and atraumatic.     Nose: No congestion or rhinorrhea.     Mouth/Throat:     Mouth: Mucous membranes are dry.     Pharynx: No oropharyngeal exudate or posterior oropharyngeal erythema.  Eyes:     Conjunctiva/sclera: Conjunctivae normal.     Pupils: Pupils are equal, round, and reactive to light.  Neck:     Musculoskeletal: Neck supple. No muscular tenderness.  Cardiovascular:     Rate and Rhythm: Regular rhythm. Tachycardia present.     Pulses: Normal pulses.     Heart sounds: No murmur.  Pulmonary:     Effort: Pulmonary effort is normal. No respiratory distress.     Breath sounds: Normal breath sounds. No wheezing, rhonchi or rales.  Chest:     Chest wall: No tenderness.  Abdominal:     General: Abdomen is flat.     Palpations: Abdomen is soft.     Tenderness: There is no abdominal tenderness.  Musculoskeletal:        General: No tenderness.     Right lower leg: No edema.     Left lower leg: No edema.  Skin:    General: Skin is warm and dry.     Capillary Refill: Capillary refill takes less than 2 seconds.     Findings: No erythema.  Neurological:     General: No focal deficit present.     Mental Status: He is alert.  Psychiatric:        Mood and Affect: Mood normal.      ED Treatments / Results  Labs (all labs ordered are listed, but only abnormal results are displayed) Labs Reviewed  COMPREHENSIVE METABOLIC PANEL - Abnormal; Notable for the following components:      Result Value   Sodium 131 (*)    Chloride 97 (*)    CO2 19 (*)    Glucose, Bld 811 (*)    BUN 24 (*)    Creatinine, Ser 1.30 (*)    AST 14 (*)    Alkaline Phosphatase 152 (*)    GFR calc non Af Amer 57 (*)    All other components within normal limits  URINALYSIS, ROUTINE W REFLEX MICROSCOPIC - Abnormal; Notable for the following components:   Color, Urine STRAW (*)    Glucose, UA >=500 (*)    Ketones, ur 40 (*)    All other components within normal limits  URINALYSIS,  MICROSCOPIC (REFLEX) - Abnormal; Notable for the following components:   Bacteria, UA RARE (*)    All other components within normal limits  CBG MONITORING, ED - Abnormal; Notable for the following components:   Glucose-Capillary >600 (*)    All other components within normal limits  POCT I-STAT EG7 - Abnormal; Notable for the following components:   pCO2, Ven 36.8 (*)    pO2, Ven 96.0 (*)    Sodium 133 (*)    Calcium, Ion 1.13 (*)    All other components within normal limits  SARS  CORONAVIRUS 2 (HOSP ORDER, PERFORMED IN [employer] [facility] VIA [facility]BBOTT ID)  URINE CULTURE  CBC WITH DIFFERENTIAL/PLATELET  I-STAT VENOUS BLOOD GAS, ED    EKG EKG Interpretation  Date/Time:  Friday Nov 27 2018 17:00:28 EDT Ventricular Rate:  107 PR Interval:    QRS Duration: 88 QT Interval:  332 QTC Calculation: 443 R Axis:   33 Text Interpretation:  Sinus tachycardia with irregular rate When compared to prior, faster rate and new t wave inversion in lead 3.  No STEMI Confirmed by Antony Blackbird [id number]) on 11/27/2018 5:39:02 PM   Radiology No results found.  Procedures Procedures (including critical care time)  CRITICAL CARE Performed by: Gwenyth Allegra  Total critical care time: 35 minutes Critical care time was exclusive of separately billable procedures and treating other patients. Critical care was necessary to treat or prevent imminent or life-threatening deterioration. Critical care was time spent personally by me on the following activities: development of treatment plan with patient and/or surrogate as well as nursing, discussions with consultants, evaluation of patient's response to treatment, examination of patient, obtaining history from patient or surrogate, ordering and performing treatments and interventions, ordering and review of laboratory studies, ordering and review of radiographic studies, pulse oximetry and re-evaluation of patient's condition.   Medications Ordered in ED  Medications  enoxaparin (LOVENOX) injection 40 mg (40 mg Subcutaneous Given 11/27/18 2334)  0.9 %  sodium chloride infusion ( Intravenous Not Given 11/27/18 2331)  dextrose 5 %-0.45 % sodium chloride infusion ( Intravenous New Bag/Given 11/27/18 2329)  insulin regular, human (MYXREDLIN) 100 units/ 100 mL infusion (has no administration in time range)  potassium chloride 10 mEq in 100 mL IVPB (10 mEq Intravenous New Bag/Given 11/27/18 2330)  sodium chloride 0.9 % bolus 1,000 mL ( Intravenous Stopped 11/27/18 1813)  sodium chloride 0.9 % bolus 1,000 mL (0 mLs Intravenous Stopped 11/27/18 1947)  potassium chloride 10 mEq in 100 mL IVPB (10 mEq Intravenous Transfusing/Transfer 11/27/18 2041)     Initial Impression / Assessment and Plan / ED Course  I have reviewed the triage vital signs and the nursing notes.  Pertinent labs & imaging results that were available during my care of the patient were reviewed by me and considered in my medical decision making (see chart for details).        Dione Mccombie is a 68 y.o. male with a past medical history significant for GERD, left inguinal hernia, and prior traumatic subdural hematoma status post surgical management who presents with 1 week of feeling dehydrated, dry mouth, development of blurry vision bilaterally, urinary frequency, and fatigue.  Patient reports that around 1 week ago he did use [employer] on his yard to kill insects and then noted afterwards likely inhaling some of the powder.  He reports no history of chemical exposure causing symptoms.  He says that he has been feeling very tired, dry mouth dehydrated, lightheaded, and having polyuria.  He has never had diabetes or any glucose management problems in the past.  He denies recent medication changes but reports he is stopped taking some of his vitamins this week feeling dehydrated.  He denies any pain.  He denies any wheezing, shortness of breath, chest pain, palpitations, nausea, vomiting, or  diarrhea.  He does report some mild constipation.  On exam, mouth is extremely dry.  Lungs are clear and chest is nontender.  No murmur.  Abdomen nontender.  Patient has no focal neurologic deficits on my exam.  He reports his vision  is slightly blurry when he tries to read something for weight which is different for him.  He had normal extraocular movements and symmetric pupils.  No evidence of trauma on exam.  Initial point-of-care glucose was greater than 600.  Given his polyuria, dehydration, fatigue, I am concerned about new onset diabetes or DKA.  He will screen laboratory testing as well as work-up to look for occult infection prompting his symptoms.  Given his symptoms, they appear to be very atypical for a cholinergic organophosphate poisoning or toxicity.  Low suspicion that these symptoms are related to his exposure last week.  Patient given fluids initially and I anticipate admission.  Patient initial glucose came back at over 800 at 811.  Ketones were found in the urine.  Patient's CO2 was low at 19 and his creatinine was a bit elevated at 1.3.  His anion gap was 15.  Patient likely has pseudohyponatremia at 131.  No leukocytosis or anemia.  Patient's blood gas was not significant acidotic at 7.398.  Chest x-ray still awaiting completion to rule out occult infection contributing to her symptoms.  Still have a very low suspicion that patient's exposure to the chemical contributed to his episode.  Although patient is not in full DKA, I am concerned that he is in early or developing DKA with all of his ketones.  I suspect he is profoundly dehydrated.  His heart rate is improved from tachycardia into the 70s and 80s after fluids.  He will be started on glucose stabilizer.  He has never had hyperglycemia or diabetes in the past.  I suspect he has new onset diabetes progressing towards DKA.  Given this new abnormality, he will need admission.  Coronavirus test is negative.   Final Clinical  Impressions(s) / ED Diagnoses   Final diagnoses:  Hyperglycemia     Clinical Impression: 1. Hyperglycemia     Disposition: Admit  This note was prepared with assistance of Dragon voice recognition software. Occasional wrong-word or sound-a-like substitutions may have occurred due to the inherent limitations of voice recognition software.      , Gwenyth Allegra, MD 11/28/18 [phone]

## 2018-11-27 NOTE — ED Notes (Signed)
[facility] notified (Tammy) - patient ready for transport 

## 2018-11-27 NOTE — H&P (Signed)
History and Physical    Jesus Fitzpatrick VVO:[id number] DOB: 11-08-1950 DOA: 11/27/2018  PCP: Patient, No Pcp Per  Patient coming from: home via [facility]   Chief Complaint: thirst and frequent urination  HPI: Jesus Fitzpatrick is a 68 y.o. male with medical history significant for infrequent medical care presents with above.  [facility]. Says hasn't been diagnosed with htn but has been told bp sometimes elevated. Denies personal or family history of diabetes. Denies toxic habits including alcohol.  Denies recent illness. Denies fever, cough, shortness of breath, chest pain, abdominal pain, nausea, vomiting, or diarrhea. No skin changes  Patient reports that for about a week has developed excessive thirst and dry mouth as well as frequent non-painful urination.  No daily medications.  ED Course: IV insulin, 4 L NS, 20 meq K, labs  Review of Systems: As per HPI otherwise 10 point review of systems negative.    Past Medical History:  Diagnosis Date  . Acid reflux   . Subdural hematoma [facility])     Past Surgical History:  Procedure Laterality Date  . BURR HOLE Right 06/09/2015   Procedure: RIGHT BURR HOLES FOR SUBDURAL HEMATOMA;  Surgeon: Consuella Lose, MD;  Location: [facility];  Service: Neurosurgery;  Laterality: Right;  . HERNIA REPAIR       reports that he has never smoked. He has never used smokeless tobacco. He reports that he does not drink alcohol or use drugs.  No Known Allergies  History reviewed. No pertinent family history.  Prior to Admission medications   Medication Sig Start Date End Date Taking? Authorizing Provider  Ascorbic Acid (VITAMIN C PO) Take 1 tablet by mouth daily.    [provider]  Cyanocobalamin (VITAMIN B12 PO) Take 1 tablet by mouth daily.    [provider]  HYDROcodone-acetaminophen (NORCO/VICODIN) 5-325 MG tablet Take 1-2 tablets by mouth every 4 (four) hours as needed for moderate pain. 06/12/15    Consuella Lose, MD  levETIRAcetam (KEPPRA) 500 MG tablet Take 1 tablet (500 mg total) by mouth 2 (two) times daily. 06/12/15   Consuella Lose, MD  Multiple Vitamin (MULTIVITAMIN WITH MINERALS) TABS tablet Take 1 tablet by mouth daily.    [provider]  Omega-3 Fatty Acids (FISH OIL PO) Take 1 capsule by mouth daily.    [provider]  omeprazole (PRILOSEC) 40 MG capsule Take 40 mg by mouth daily.    [provider]    Physical Exam: Vitals:   11/27/18 1830 11/27/18 1930 11/27/18 2000 11/27/18 2031  BP: (!) 157/81 (!) 159/76 (!) 154/90 (!) 177/88  Pulse: 67 76 75 72  Resp: 18 12 15 14   Temp:    98.7 F (37.1 C)  TempSrc:    Oral  SpO2: 98% 95% 96% 99%  Weight:      Height:        Constitutional: No acute distress Head: Atraumatic Eyes: Conjunctiva clear ENM: Moist mucous membranes. Normal dentition.  Neck: Supple Respiratory: Clear to auscultation bilaterally, no wheezing/rales/rhonchi. Normal respiratory effort. No accessory muscle use. . Cardiovascular: Regular rate and rhythm. No murmurs/rubs/gallops. Abdomen: Non-tender, non-distended. No masses. No rebound or guarding. Positive bowel sounds. Musculoskeletal: No joint deformity upper and lower extremities. Normal ROM, no contractures. Normal muscle tone.  Skin: No rashes, lesions, or ulcers.  Extremities: No peripheral edema. Palpable peripheral pulses. Neurologic: Alert, moving all 4 extremities. Psychiatric: Normal insight and judgement.   Labs on Admission: I have personally reviewed following  labs and imaging studies  CBC: Recent Labs  Lab 11/27/18 1707 11/27/18 1741  WBC 8.5  --   NEUTROABS 6.8  --   HGB 16.7 16.7  HCT 49.5 49.0  MCV 92.2  --   PLT 237  --    Basic Metabolic Panel: Recent Labs  Lab 11/27/18 1707 11/27/18 1741  NA 131* 133*  K 4.7 4.8  CL 97*  --   CO2 19*  --   GLUCOSE 811*  --   BUN 24*  --   CREATININE 1.30*  --   CALCIUM 9.0  --    GFR:  Estimated Creatinine Clearance: 59.9 mL/min (A) (by C-G formula based on SCr of 1.3 mg/dL (H)). Liver Function Tests: Recent Labs  Lab 11/27/18 1707  AST 14*  ALT 19  ALKPHOS 152*  BILITOT 1.0  PROT 7.1  ALBUMIN 4.5   No results for input(s): LIPASE, AMYLASE in the last 168 hours. No results for input(s): AMMONIA in the last 168 hours. Coagulation Profile: No results for input(s): INR, PROTIME in the last 168 hours. Cardiac Enzymes: No results for input(s): CKTOTAL, CKMB, CKMBINDEX, TROPONINI in the last 168 hours. BNP (last 3 results) No results for input(s): PROBNP in the last 8760 hours. HbA1C: No results for input(s): HGBA1C in the last 72 hours. CBG: Recent Labs  Lab 11/27/18 1659 11/27/18 1844 11/27/18 1952 11/27/18 2037 11/27/18 2146  GLUCAP >600* >600* 482* 427* 333*   Lipid Profile: No results for input(s): CHOL, HDL, LDLCALC, TRIG, CHOLHDL, LDLDIRECT in the last 72 hours. Thyroid Function Tests: No results for input(s): TSH, T4TOTAL, FREET4, T3FREE, THYROIDAB in the last 72 hours. Anemia Panel: No results for input(s): VITAMINB12, FOLATE, FERRITIN, TIBC, IRON, RETICCTPCT in the last 72 hours. Urine analysis:    Component Value Date/Time   COLORURINE STRAW (A) 11/27/2018 1745   APPEARANCEUR CLEAR 11/27/2018 1745   LABSPEC 1.010 11/27/2018 1745   PHURINE 5.5 11/27/2018 1745   GLUCOSEU >=500 (A) 11/27/2018 1745   HGBUR NEGATIVE 11/27/2018 1745   BILIRUBINUR NEGATIVE 11/27/2018 1745   KETONESUR 40 (A) 11/27/2018 1745   PROTEINUR NEGATIVE 11/27/2018 1745   NITRITE NEGATIVE 11/27/2018 1745   LEUKOCYTESUR NEGATIVE 11/27/2018 1745    Radiological Exams on Admission: Dg Chest Portable 1 View  Result Date: 11/27/2018 CLINICAL DATA:  Blurred vision and dizziness.  Diabetic ketoacidosis EXAM: PORTABLE CHEST 1 VIEW COMPARISON:  May 27, 2015. FINDINGS: No edema or consolidation. Heart size and pulmonary vascularity are within normal limits. No adenopathy. No  bone lesions. IMPRESSION: No edema or consolidation. Electronically Signed   By: Lowella Grip III M.D.   On: 11/27/2018 19:26    EKG: Independently reviewed. Sinus tach  Assessment/Plan Principal Problem:   Hyperglycemia Active Problems:   Elevated blood pressure reading   # Hyperglycemia # type 2 diabetes mellitus # diabetic ketoacidosis - symptomatic hypoglycemia, gap 15, bicarb mildly low, vbg wnl, ketones in urine. Hemodynamically stable, glucose improved from 800s to 300s at [facility] ED after IV insulin and 4 L NS. Tachycardic, with mild aki. No symptoms to suggest infection or other illness as instigator.  - continue DKA pathway (IV insulin, NS for now, K supplementation) - a1c, lipids, tsh, upc  # Elevated blood pressure - spotty primary care, told elevated bp in past, suspect this represents chronic htn. Asymptomatic. - monitor bp, if remains elevated, institute antihypertensives.  # acute kidney injury - presumed. Cr 1.3, several years ago normal - fluid resuscitation as above - UPC  DVT prophylaxis: lovenox Code Status: full  Family Communication: daughter Claiborne Billings [phone]  Disposition Plan: tbd  Consults called: none  Admission status: step-down    Desma Maxim MD [facility] Pager [phone]  If 7PM-7AM, please contact night-coverage [email or web address] Password [facility]  11/27/2018, 10:41 PM

## 2018-11-27 NOTE — ED Notes (Signed)
Contacted [facility] - patient's bed status needs to be changed to step down (insulin drip) - hospitalist will contact Dr. Sherry Ruffing

## 2018-11-27 NOTE — ED Notes (Signed)
ED Provider at bedside. 

## 2018-11-28 DIAGNOSIS — E1110 Type 2 diabetes mellitus with ketoacidosis without coma: Principal | ICD-10-CM

## 2018-11-28 DIAGNOSIS — E785 Hyperlipidemia, unspecified: Secondary | ICD-10-CM

## 2018-11-28 DIAGNOSIS — E872 Acidosis: Secondary | ICD-10-CM

## 2018-11-28 DIAGNOSIS — E1165 Type 2 diabetes mellitus with hyperglycemia: Secondary | ICD-10-CM

## 2018-11-28 DIAGNOSIS — R030 Elevated blood-pressure reading, without diagnosis of hypertension: Secondary | ICD-10-CM

## 2018-11-28 LAB — GLUCOSE, CAPILLARY
Glucose-Capillary: 138 mg/dL — ABNORMAL HIGH (ref 70–99)
Glucose-Capillary: 165 mg/dL — ABNORMAL HIGH (ref 70–99)
Glucose-Capillary: 174 mg/dL — ABNORMAL HIGH (ref 70–99)
Glucose-Capillary: 194 mg/dL — ABNORMAL HIGH (ref 70–99)
Glucose-Capillary: 237 mg/dL — ABNORMAL HIGH (ref 70–99)
Glucose-Capillary: 249 mg/dL — ABNORMAL HIGH (ref 70–99)
Glucose-Capillary: 256 mg/dL — ABNORMAL HIGH (ref 70–99)
Glucose-Capillary: 270 mg/dL — ABNORMAL HIGH (ref 70–99)
Glucose-Capillary: 348 mg/dL — ABNORMAL HIGH (ref 70–99)
Glucose-Capillary: 67 mg/dL — ABNORMAL LOW (ref 70–99)

## 2018-11-28 LAB — COMPREHENSIVE METABOLIC PANEL
ALT: 16 U/L (ref 0–44)
AST: 16 U/L (ref 15–41)
Albumin: 3.5 g/dL (ref 3.5–5.0)
Alkaline Phosphatase: 109 U/L (ref 38–126)
Anion gap: 8 (ref 5–15)
BUN: 16 mg/dL (ref 8–23)
CO2: 23 mmol/L (ref 22–32)
Calcium: 7.8 mg/dL — ABNORMAL LOW (ref 8.9–10.3)
Chloride: 108 mmol/L (ref 98–111)
Creatinine, Ser: 0.86 mg/dL (ref 0.61–1.24)
GFR calc Af Amer: >60 mL/min (ref >60)
GFR calc non Af Amer: >60 mL/min (ref >60)
Glucose, Bld: 243 mg/dL — ABNORMAL HIGH (ref 70–99)
Potassium: 3.9 mmol/L (ref 3.5–5.1)
Sodium: 139 mmol/L (ref 135–145)
Total Bilirubin: 0.3 mg/dL (ref 0.3–1.2)
Total Protein: 5.8 g/dL — ABNORMAL LOW (ref 6.5–8.1)

## 2018-11-28 LAB — LIPID PANEL
Cholesterol: 205 mg/dL — ABNORMAL HIGH (ref 0–200)
HDL: 24 mg/dL — ABNORMAL LOW (ref >40)
LDL Cholesterol: UNDETERMINED mg/dL (ref 0–99)
Total CHOL/HDL Ratio: 8.5 RATIO
Triglycerides: 463 mg/dL — ABNORMAL HIGH (ref <150)
VLDL: UNDETERMINED mg/dL (ref 0–40)

## 2018-11-28 LAB — BASIC METABOLIC PANEL
Anion gap: 4 — ABNORMAL LOW (ref 5–15)
Anion gap: 4 — ABNORMAL LOW (ref 5–15)
Anion gap: 6 (ref 5–15)
Anion gap: 7 (ref 5–15)
BUN: 15 mg/dL (ref 8–23)
BUN: 17 mg/dL (ref 8–23)
BUN: 18 mg/dL (ref 8–23)
BUN: 18 mg/dL (ref 8–23)
CO2: 21 mmol/L — ABNORMAL LOW (ref 22–32)
CO2: 24 mmol/L (ref 22–32)
CO2: 24 mmol/L (ref 22–32)
CO2: 26 mmol/L (ref 22–32)
Calcium: 7.7 mg/dL — ABNORMAL LOW (ref 8.9–10.3)
Calcium: 8.2 mg/dL — ABNORMAL LOW (ref 8.9–10.3)
Calcium: 8.2 mg/dL — ABNORMAL LOW (ref 8.9–10.3)
Calcium: 8.4 mg/dL — ABNORMAL LOW (ref 8.9–10.3)
Chloride: 107 mmol/L (ref 98–111)
Chloride: 110 mmol/L (ref 98–111)
Chloride: 112 mmol/L — ABNORMAL HIGH (ref 98–111)
Chloride: 112 mmol/L — ABNORMAL HIGH (ref 98–111)
Creatinine, Ser: 0.76 mg/dL (ref 0.61–1.24)
Creatinine, Ser: 0.91 mg/dL (ref 0.61–1.24)
Creatinine, Ser: 0.94 mg/dL (ref 0.61–1.24)
Creatinine, Ser: 0.97 mg/dL (ref 0.61–1.24)
GFR calc Af Amer: >60 mL/min (ref >60)
GFR calc Af Amer: >60 mL/min (ref >60)
GFR calc Af Amer: >60 mL/min (ref >60)
GFR calc Af Amer: >60 mL/min (ref >60)
GFR calc non Af Amer: >60 mL/min (ref >60)
GFR calc non Af Amer: >60 mL/min (ref >60)
GFR calc non Af Amer: >60 mL/min (ref >60)
GFR calc non Af Amer: >60 mL/min (ref >60)
Glucose, Bld: 227 mg/dL — ABNORMAL HIGH (ref 70–99)
Glucose, Bld: 342 mg/dL — ABNORMAL HIGH (ref 70–99)
Glucose, Bld: 77 mg/dL (ref 70–99)
Glucose, Bld: 78 mg/dL (ref 70–99)
Potassium: 3.4 mmol/L — ABNORMAL LOW (ref 3.5–5.1)
Potassium: 3.5 mmol/L (ref 3.5–5.1)
Potassium: 3.6 mmol/L (ref 3.5–5.1)
Potassium: 4.1 mmol/L (ref 3.5–5.1)
Sodium: 132 mmol/L — ABNORMAL LOW (ref 135–145)
Sodium: 140 mmol/L (ref 135–145)
Sodium: 142 mmol/L (ref 135–145)
Sodium: 143 mmol/L (ref 135–145)

## 2018-11-28 LAB — CBC WITH DIFFERENTIAL/PLATELET
Abs Immature Granulocytes: 0.03 10*3/uL (ref 0.00–0.07)
Basophils Absolute: 0.1 10*3/uL (ref 0.0–0.1)
Basophils Relative: 1 %
Eosinophils Absolute: 0.3 10*3/uL (ref 0.0–0.5)
Eosinophils Relative: 4 %
HCT: 43.2 % (ref 39.0–52.0)
Hemoglobin: 14.6 g/dL (ref 13.0–17.0)
Immature Granulocytes: 0 %
Lymphocytes Relative: 19 %
Lymphs Abs: 1.5 10*3/uL (ref 0.7–4.0)
MCH: 31.3 pg (ref 26.0–34.0)
MCHC: 33.8 g/dL (ref 30.0–36.0)
MCV: 92.5 fL (ref 80.0–100.0)
Monocytes Absolute: 0.6 10*3/uL (ref 0.1–1.0)
Monocytes Relative: 8 %
Neutro Abs: 5.5 10*3/uL (ref 1.7–7.7)
Neutrophils Relative %: 68 %
Platelets: 197 10*3/uL (ref 150–400)
RBC: 4.67 MIL/uL (ref 4.22–5.81)
RDW: 12.4 % (ref 11.5–15.5)
WBC: 8 10*3/uL (ref 4.0–10.5)
nRBC: 0 % (ref 0.0–0.2)

## 2018-11-28 LAB — MAGNESIUM: Magnesium: 2.2 mg/dL (ref 1.7–2.4)

## 2018-11-28 LAB — LDL CHOLESTEROL, DIRECT: Direct LDL: 85.8 mg/dL (ref 0–99)

## 2018-11-28 LAB — CBC
HCT: 43.8 % (ref 39.0–52.0)
Hemoglobin: 15.1 g/dL (ref 13.0–17.0)
MCH: 31.9 pg (ref 26.0–34.0)
MCHC: 34.5 g/dL (ref 30.0–36.0)
MCV: 92.4 fL (ref 80.0–100.0)
Platelets: 217 10*3/uL (ref 150–400)
RBC: 4.74 MIL/uL (ref 4.22–5.81)
RDW: 12.3 % (ref 11.5–15.5)
WBC: 9.1 10*3/uL (ref 4.0–10.5)
nRBC: 0 % (ref 0.0–0.2)

## 2018-11-28 LAB — HEMOGLOBIN A1C
Hgb A1c MFr Bld: 11.7 % — ABNORMAL HIGH (ref 4.8–5.6)
Mean Plasma Glucose: 289.09 mg/dL

## 2018-11-28 LAB — PHOSPHORUS: Phosphorus: 3 mg/dL (ref 2.5–4.6)

## 2018-11-28 LAB — PROTEIN / CREATININE RATIO, URINE
Creatinine, Urine: 96.94 mg/dL
Protein Creatinine Ratio: 0.21 mg/mg{Cre} — ABNORMAL HIGH (ref 0.00–0.15)
Total Protein, Urine: 20 mg/dL

## 2018-11-28 LAB — SARS CORONAVIRUS 2 BY RT PCR (HOSPITAL ORDER, PERFORMED IN ~~LOC~~ HOSPITAL LAB): SARS Coronavirus 2: NEGATIVE

## 2018-11-28 LAB — TSH: TSH: 3.741 u[IU]/mL (ref 0.350–4.500)

## 2018-11-28 LAB — HIV ANTIBODY (ROUTINE TESTING W REFLEX): HIV Screen 4th Generation wRfx: NONREACTIVE

## 2018-11-28 MED ORDER — INSULIN GLARGINE 100 UNIT/ML ~~LOC~~ SOLN
10.0000 [IU] | Freq: Once | SUBCUTANEOUS | Status: AC
  Administered 2018-11-28: 10 [IU] via SUBCUTANEOUS
  Filled 2018-11-28: qty 0.1

## 2018-11-28 MED ORDER — LIVING WELL WITH DIABETES BOOK
Freq: Once | Status: AC
  Administered 2018-11-28: 19:00:00
  Filled 2018-11-28: qty 1

## 2018-11-28 MED ORDER — INSULIN ASPART 100 UNIT/ML ~~LOC~~ SOLN
0.0000 [IU] | Freq: Three times a day (TID) | SUBCUTANEOUS | Status: DC
  Administered 2018-11-28: 5 [IU] via SUBCUTANEOUS
  Administered 2018-11-29: 9 [IU] via SUBCUTANEOUS
  Administered 2018-11-29: 5 [IU] via SUBCUTANEOUS

## 2018-11-28 MED ORDER — POTASSIUM CHLORIDE CRYS ER 20 MEQ PO TBCR
40.0000 meq | EXTENDED_RELEASE_TABLET | Freq: Two times a day (BID) | ORAL | Status: AC
  Administered 2018-11-28 (×2): 40 meq via ORAL
  Filled 2018-11-28 (×2): qty 2

## 2018-11-28 MED ORDER — FENOFIBRATE 54 MG PO TABS
54.0000 mg | ORAL_TABLET | Freq: Every day | ORAL | Status: DC
  Administered 2018-11-28 – 2018-11-29 (×2): 54 mg via ORAL
  Filled 2018-11-28 (×2): qty 1

## 2018-11-28 MED ORDER — ATORVASTATIN CALCIUM 40 MG PO TABS
40.0000 mg | ORAL_TABLET | Freq: Every day | ORAL | Status: DC
  Administered 2018-11-28: 40 mg via ORAL
  Filled 2018-11-28: qty 1

## 2018-11-28 MED ORDER — SODIUM CHLORIDE 0.9 % IV SOLN
INTRAVENOUS | Status: DC
  Administered 2018-11-28: 03:00:00 via INTRAVENOUS

## 2018-11-28 MED ORDER — INSULIN ASPART 100 UNIT/ML ~~LOC~~ SOLN: 0.0000 [IU] | Freq: Every day | SUBCUTANEOUS | Status: DC

## 2018-11-28 MED ORDER — INSULIN ASPART PROT & ASPART (70-30 MIX) 100 UNIT/ML ~~LOC~~ SUSP
12.0000 [IU] | Freq: Two times a day (BID) | SUBCUTANEOUS | Status: DC
  Administered 2018-11-28 – 2018-11-29 (×2): 12 [IU] via SUBCUTANEOUS
  Filled 2018-11-28: qty 10

## 2018-11-28 MED ORDER — INSULIN ASPART 100 UNIT/ML ~~LOC~~ SOLN: 0.0000 [IU] | Freq: Three times a day (TID) | SUBCUTANEOUS | Status: DC

## 2018-11-28 MED ORDER — INSULIN ASPART 100 UNIT/ML ~~LOC~~ SOLN
0.0000 [IU] | SUBCUTANEOUS | Status: DC
  Administered 2018-11-28: 11 [IU] via SUBCUTANEOUS
  Administered 2018-11-28: 5 [IU] via SUBCUTANEOUS

## 2018-11-28 MED ORDER — INSULIN ASPART 100 UNIT/ML ~~LOC~~ SOLN: 3.0000 [IU] | Freq: Three times a day (TID) | SUBCUTANEOUS | Status: DC

## 2018-11-28 MED ORDER — LISINOPRIL 20 MG PO TABS
20.0000 mg | ORAL_TABLET | Freq: Every day | ORAL | Status: DC
  Administered 2018-11-28 – 2018-11-29 (×2): 20 mg via ORAL
  Filled 2018-11-28: qty 1
  Filled 2018-11-28: qty 2

## 2018-11-28 NOTE — Discharge Instructions (Addendum)
Glucose Products:  ReliOn glucose products raise low blood sugar fast. Tablets are free of fat, caffeine, sodium and gluten. They are portable and easy to carry, making it easier for people with diabetes to BE PREPARED for lows.  Glucose Tablets Available in 6 flavors  10 ct...................................... $1.00  50 ct...................................... $3.98 Glucose Shot..................................$1.48 Glucose Gel....................................$3.44  Alcohol Swabs Alcohol swabs are used to sterilize your injection site. All of our swabs are individually wrapped for maximum safety, convenience and moisture retention. ReliOn Alcohol Swabs  100 ct Swabs..............................$1.00  400 ct Swabs..............................$3.74  Lancets ReliOn offers three lancet options conveniently designed to work with almost every lancing device. Each features a protective disk, which guarantees sterility before testing. ReliOn Lancets  100 ct Lancets $1.56  200 ct Lancets $2.64 Available in Ultra-Thin, Thin & Micro-Thin ReliOn 2-IN-1 Lancing Device  50 ct Lancets..................................... $3.44 Available in 30 gauge and 25 gauge ReliOn Lancing Device....................$5.84  Blood Glucose Monitors ReliOn offers a full range of blood glucose testing options to provide an accurate, affordable system that meets each person's unique needs and preferences. Prime Meter....................................... $9.00 Prime Test Strips  25 test strips.................................... $5.00  50 test strips.................................... $9.00  100 test strips.................................$17.88 Premier [employer] Meter    $18.98 Premier Voice Meter  .  $14.98 Premier Test Strips  50 test strips.................................... $9.00  100 test strips.................................$17.88 Premier [employer] Kit     $19.44 Kit includes:  50 test strips  10 lancets  Lancing device  Carry case  Human Insulin   Novolin/ReliOn Insulin Pens*    $42.88 70/30  Insulin Delivery ReliOn syringes and pen needles provide precision technology, comfort and accuracy in insulin delivery at affordable prices. ReliOn Pen Needles*  50 ct....................................................$9.00 Available in 29m      Insulin Treatment for Diabetes Mellitus Diabetes (diabetes mellitus) is a long-term (chronic) disease. It occurs when the body does not properly use sugar (glucose) that is released from food after digestion. Glucose levels are controlled by a hormone called insulin. Insulin is made in the pancreas, which is an organ behind the stomach.    If you have type 2 diabetes, you might need to take insulin along with other medicines. In type 2 diabetes, one or both of these problems may be present: ? The pancreas does not make enough insulin. ? Cells in the body do not respond properly to insulin that the body makes (insulin resistance). You must use insulin correctly to control your diabetes. You must have some insulin in your body at all times. Insulin treatment varies depending on your type of diabetes, your treatment goals, and your medical history. Ask questions to understand your insulin treatment plan so you can be an active partner in managing your diabetes. How is insulin given? Your insulin is given through a shot (injection). It is injected using an insulin pen  Where on the body should insulin be injected?  Insulin is injected into a layer of fatty tissue under the skin. Good places to inject insulin include:  Abdomen. Generally, the abdomen is the best place to inject insulin. However, you should avoid any area that is less than 2 inches (5 cm) from the belly button (navel).  Front of thigh.  Upper, outer side of thigh.  Upper, outer side of arm.  Upper,  outer part of buttock. It is important to:  Give your injection in a slightly different place each time. This helps to prevent irritation and improve absorption.  Avoid injecting into areas that have scar  tissue. Usually, you will give yourself insulin injections.   What type of insulin are you on?   Combination insulin with both long acting insulin and short acting insulin called 70/30 insulin. Take this twice a day with meals: ? Starts working in about 30 minutes. ? Can last for 6-10 hours. ? Should be taken about 30 minutes before you start eating breakfast and supper.   What are the side effects of insulin? Possible side effects of insulin treatment include:  Low blood glucose (hypoglycemia).  Weight gain.  High blood glucose (hyperglycemia).  Skin injury or irritation. Some of these side effects can be caused by using improper injection technique. Be sure to learn how to inject insulin properly.   Tight control, or intensive therapy. This means keeping your blood glucose as close to your target as possible. Your target is keeping your glucose in the 100 range and preventing your blood glucose from getting too high after meals. People who have tight control of their diabetes have fewer long-term problems caused by diabetes.  Follow these instructions at home:  Talk with your health care provider or pharmacist about the type of insulin you should take and when you should take it. You should know when your insulin goes up the most (peaks) and when it wears off. You need this information so you can plan your meals and exercise. Work with your health care provider to:  Check your blood glucose every day. Your health care provider will tell you how often and when you should do this.  Manage your: ? Weight. ? Blood pressure. ? Cholesterol. ? Stress.  Eat a healthy diet.  Exercise regularly. Summary  Diabetes is a long-term (chronic) disease. It occurs when the body does  not properly use sugar (glucose) that is released from food after digestion. Glucose levels are controlled by a hormone called insulin, which is made in an organ behind your stomach (pancreas).  You must use insulin correctly to control your diabetes. You must have some insulin in your body at all times.     Insulin Injection Instructions, Using Insulin Pens, Adult A subcutaneous injection is a shot of medicine that is injected into the layer of fat and tissue between skin and muscle. People with type 1 diabetes must take insulin because their bodies do not make it. People with type 2 diabetes may need to take insulin.  Supplies needed:  Soap and water to wash hands.  Your insulin pen.  A new, unused needle.  Alcohol wipes.  A disposal container that is meant for sharp items (sharps container), such as an empty plastic bottle with a cover. How to choose a site for injection  The body absorbs insulin differently, depending on where the insulin is injected (injection site). It is best to inject insulin into the same body area each time (for example, always in the abdomen), but you should use a different spot in that area for each injection. Do not inject the insulin in the same spot each time. There are five main areas that can be used for injecting. These areas include:  Abdomen. This is the preferred area.  Front of thigh.  Upper, outer side of thigh.  Upper, outer side of arm.  Upper, outer part of buttock. How to use an insulin pen   First, follow the steps for Get ready, then continue with the steps for Inject the insulin. Get ready 1. Wash your hands with soap and water. If soap and water are  not available, use hand sanitizer. 2. Before you give yourself an insulin injection, be sure to test your blood sugar level (blood glucose level) and write down that number. Follow any instructions from your health care provider about what to do if your blood glucose level is higher  or lower than your normal range. 3. Check the expiration date and the type of insulin that is in the pen. 4. If you are using CLOUDY insulin, do not shake the pen to get the injection ready. Instead, get it ready in one of these ways: ? Gently roll the pen between your palms several times. ? Tip the pen up and down several times. 5. Remove the cap from the insulin pen. 6. Use an alcohol wipe to clean the rubber tip of the pen. 7. Remove the protective paper tab from the disposable needle. Do not let the needle touch anything. 8. Screw a new, unused needle onto the pen. 9. Remove the outer plastic needle cover. Do not throw away the outer plastic cover yet. ? If the pen uses a special safety needle, leave the inner needle shield in place. ? If the pen does not use a special safety needle, remove the inner plastic cover from the needle. 10.  Hold the pen with the needle pointing up, dial up to 2 units and push the button on the opposite end of the pen until a drop of insulin appears at the needle tip. If no insulin appears, repeat this step. 11. Turn the button (dial) to the number of units of insulin that you will be injecting. Inject the insulin 1. Use an alcohol wipe to clean the site where you will be injecting the needle. Let the site air-dry. 2. Hold the pen in the palm of your writing hand like a pencil. 3. If directed by your health care provider, use your other hand to pinch and hold about an inch (2.5 cm) of skin at the injection site. Do not directly touch the cleaned part of the skin. 4. Gently but quickly, use your writing hand to put the needle straight into the skin. The needle should be at a 90-degree angle (perpendicular) to the skin. 5. When the needle is completely inserted into the skin, use your thumb or index finger of your writing hand to push the top button of the pen down all the way to inject the insulin. 6. Let go of the skin that you are pinching. Continue to hold the  pen in place with your writing hand. 7. Wait 5-6 seconds, then pull the needle straight out of the skin. This will allow all of the insulin to go from the pen and needle into your body. 8. Carefully put the larger (outer) plastic cover of the needle back over the needle, then unscrew the capped needle and discard it in a sharps container, such as an empty plastic bottle with a cover. 9. Put the plastic cap back on the insulin pen. How to throw away supplies  Discard all used needles in a puncture-proof sharps disposal container. You can ask your local pharmacy about where you can get this kind of disposal container, or you can use an empty plastic liquid laundry detergent bottle that has a cover.  Follow the disposal regulations for the area where you live. Do not use any needle more than one time.  Throw away empty disposable pens in the regular trash. Questions to ask your health care provider  How often should I be taking  insulin? Before breakfast and before supper   How often should I check my blood glucose? 2 times a day first thing in the morning before you eat and before activity, second check alternate (before lunch one day, before supper on another day, before bed time on another day). This will give [place] a better picture on how you sugar responds throughout the day.   What amount of insulin should I be taking at each time? This is written where your medications are listed at in this packet   What are the side effects?  Injection site reactions and hypoglycemia or low sugar levels (below 70).   What should I do if my blood glucose is too high? Make sure you have had your insulin for that day. If greater than 200 all the time call PCP office. If glucose is extremely high call PCP and they may advise you to go to the ED.   What should I do if my blood glucose is too low? This is a blood sugar less than 70. You may feel foggy headed sweaty and shaky. Eat a fast sugar (not peanut  butter or chocolate, (the fat and oil content of these processes slower) get glucose/sugar tablets from [facility] (1 tube for $1) eat 4 OR drink 4oz of regular juice OR soda. Wait 15 minutes recheck your sugar if you can. If not take 4 more glucose tablets, OR drink 4 oz of regular juice OR regular soda. If you are having to repeat this several times call 911.   What should I do if I forget to take my insulin? Wait till the next scheduled time and take the prescribed dose.   What number should I call if I have questions? Your primary care doctor's office or if an emergency 911.  Where to find more information  American Diabetes Association (ADA): www.diabetes.org  American Association of Diabetes Educators (AADE) Patient Resources: https://www.diabeteseducator.org Summary  A subcutaneous injection is a shot of medicine that is injected into the layer of fat and tissue between skin and muscle.  Before you give yourself an insulin injection, be sure to test your blood sugar level (blood glucose level) and write down that number.  Check the expiration date and the type of insulin that is in the pen. The type of insulin that you take may determine how many injections you give yourself and when you need to give the injections.  It is best to inject insulin into the same body area each time (for example, always in the abdomen), but you should use a different spot in that area for each injection. This information is not intended to replace advice given to you by your health care provider. Make sure you discuss any questions you have with your health care provider. Document Released: 07/28/2015 Document Revised: 07/14/2017 Document Reviewed: 07/28/2015 Elsevier Interactive Patient Education  2019 [employer].

## 2018-11-28 NOTE — Progress Notes (Signed)
PROGRESS NOTE    Jesus Fitzpatrick  IEP:[id number] DOB: 1950/10/18 DOA: 11/27/2018 PCP: Patient, No Pcp Per  Brief Narrative:  HPI per Dr. Laurey Arrow on 11/27/2018 CC: thirst and frequent urination  Jesus Fitzpatrick is a 68 y.o. male with medical history significant for infrequent medical care presents with above.  [facility]. Says hasn't been diagnosed with htn but has been told bp sometimes elevated. Denies personal or family history of diabetes. Denies toxic habits including alcohol.  Denies recent illness. Denies fever, cough, shortness of breath, chest pain, abdominal pain, nausea, vomiting, or diarrhea. No skin changes  Patient reports that for about a week has developed excessive thirst and dry mouth as well as frequent non-painful urination.  No daily medications.  **Interim History  Blood sugars improved and is transition to Lantus.  Hemoglobin A1c was 1.7 and will have diabetes coordinator evaluate and do diabetic teaching as patient likely will need to go home on insulin.  Patient blood pressure was also elevated so we will start on lisinopril.  Lipid panel also showed elevated cholesterol so we will also start on atorvastatin.  Assessment & Plan:   Principal Problem:   Hyperglycemia Active Problems:   Elevated blood pressure reading  Uncontrolled Hyperglycemia and Mild Diabetic Ketoacidosis in the Setting of New Onset Diabetes Mellitus Type 2; DKA is improved - Had symptomatic Hyperglycemia, gap 15, bicarb mildly low, vbg wnl, ketones in urine.  -Hemodynamically stable, glucose improved from 800s to 300s at [facility] ED after IV insulin and 4 L NS.  -Was Tachycardic, with mild AKI and now imoproved -No symptoms to suggest infection or other illness as instigator; urinalysis is unremarkable besides glucosuria but no edema or consolidation -Continued DKA pathway (IV insulin, NS for now, K supplementation) -Transitioned to 10 units of Lantus and  started on Sensitive Novolog SSI AC/HS -Discussed with Diabetes Education Coordinator and will need Diabetic Teaching with Flex Pens -Will start patient on 70/30 12 Units BID and continue Sensitive Novolog SSI AC/HS -HbA1c was 11.7 -TSH was 3.741 -Will need Diabetic Foot Exam and Retinopathy Screen as an outpatient  -Continue to Monitor CBG's and ranging from 67-348   Elevated Blood Pressure/Essential Hypertension  -Has had spotty primary care, told elevated bp in past, suspect this represents chronic HTN. Asymptomatic. -BP remained elevated and last reading was -Will start Lisinopril 20 mg po Daily -Continue to Monitor Vital Signs per Protocol  Hyperlipidemia/Dyslipidemia  -Patient is total cholesterol/HDL ratio 8.5, cholesterol level of 205, HDL 24, LDL was unable to be calculated, triglycerides were 463, and VLDL is unable to be calculated -We will start Atorvastatin 40 mg p.o. nightly along with Fenofibrate 54 mg po Daily   AKI  -In the setting of Dehydration from DKA -Studies done and showed straw-colored urine with greater than 500 glucose, rare bacteria, 0-5 WBCs, and negative protein; urine culture still pending -Urine protein total was 20 and protein creatinine ratio 0.21 and urine creatinine is 96.94 -Given IVF with NS and received 4 Liter Boluses -Stopped IVF today -BUN/Cr is now 15/0.76 -Continue to Monitor and Trend Renal Fxn -Repeat CMP in AM  Hyponatremia -In the setting of Uncontrolled Hyperglycemia -Patient's IVF with NS as now stopped -Repeat BMP this Afternoon shoed Na+ of 132 and Glucose of 342 -Continue to Monitor and Trend -Repeat CMP in AM  Metabolic Acidosis -Mild. DKA is improved -IVF sotpped -CO2 is now 21 and AG is 5 -Continue to Monitor and Repeat CMP in AM  DVT prophylaxis: Enoxaparin 40 mg sq q24h Code Status: FULL CODE Family Communication: No family present at bedside Disposition Plan: Transfer to the Medical Floor with Telemetry   Consultants:   Diabetes Education Coordinator    Procedures: None   Antimicrobials:  Anti-infectives (From admission, onward)   None     Subjective: Seen and examined at bedside and states that he had increased thirst and significant urination.  States that his mouth was always "dry".  No chest pain, lightheadedness or dizziness..  States that he is feeling better.  No other concerns complaints at this time and does not have a primary care physician so will attempt to set up with one.   Objective: Vitals:   11/28/18 0600 11/28/18 0800 11/28/18 1138 11/28/18 1200  BP: 126/60  (!) 164/65   Pulse: 73 68 71   Resp: (!) 22 (!) 23 16   Temp:  97.7 F (36.5 C)  97.7 F (36.5 C)  TempSrc:  Oral  Oral  SpO2: 96% 100% 97%   Weight:      Height:        Intake/Output Summary (Last 24 hours) at 11/28/2018 1233 Last data filed at 11/28/2018 1100 Gross per 24 hour  Intake 2083.89 ml  Output 1900 ml  Net 183.89 ml   Filed Weights   11/27/18 1656  Weight: 74.8 kg   Examination: Physical Exam:  Constitutional: WN/WD Caucasian Male in NAD and appears calm and comfortable Eyes:  Lids and conjunctivae normal, sclerae anicteric  ENMT: External Ears, Nose appear normal. Grossly normal hearing.  Neck: Appears normal, supple, no cervical masses, normal ROM, no appreciable thyromegaly; no JVD Respiratory: Diminished to auscultation bilaterally, no wheezing, rales, rhonchi or crackles. Normal respiratory effort and patient is not tachypenic. No accessory muscle use.  Cardiovascular: RRR, no murmurs / rubs / gallops. S1 and S2 auscultated.  Abdomen: Soft, non-tender, non-distended. No masses palpated. No appreciable hepatosplenomegaly. Bowel sounds positive x4.  GU: Deferred. Musculoskeletal: No clubbing / cyanosis of digits/nails. No joint deformity upper and lower extremities. Skin: No rashes, lesions, ulcers. No induration; Warm and dry.  Neurologic: CN 2-12 grossly intact with no focal  deficits. Strength 5/5 in all 4. Romberg sign cerebellar reflexes not assessed.  Psychiatric: Normal judgment and insight. Alert and oriented x 3. Normal mood and appropriate affect.   Data Reviewed: I have personally reviewed following labs and imaging studies  CBC: Recent Labs  Lab 11/27/18 1707 11/27/18 1741 11/27/18 2353 11/28/18 0804  WBC 8.5  --  9.1 8.0  NEUTROABS 6.8  --   --  5.5  HGB 16.7 16.7 15.1 14.6  HCT 49.5 49.0 43.8 43.2  MCV 92.2  --  92.4 92.5  PLT 237  --  217 154   Basic Metabolic Panel: Recent Labs  Lab 11/27/18 1707 11/27/18 1741 11/27/18 2353 11/28/18 0323 11/28/18 0804  NA 131* 133* 140 142  143 139  K 4.7 4.8 3.6 3.5  3.4* 3.9  CL 97*  --  110 112*  112* 108  CO2 19*  --  24 26  24 23   GLUCOSE 811*  --  227* 77  78 243*  BUN 24*  --  18 18  17 16   CREATININE 1.30*  --  0.97 0.91  0.94 0.86  CALCIUM 9.0  --  8.4* 8.2*  8.2* 7.8*  MG  --   --   --   --  2.2  PHOS  --   --   --   --  3.0   GFR: Estimated Creatinine Clearance: 90.6 mL/min (by C-G formula based on SCr of 0.86 mg/dL). Liver Function Tests: Recent Labs  Lab 11/27/18 1707 11/28/18 0804  AST 14* 16  ALT 19 16  ALKPHOS 152* 109  BILITOT 1.0 0.3  PROT 7.1 5.8*  ALBUMIN 4.5 3.5   No results for input(s): LIPASE, AMYLASE in the last 168 hours. No results for input(s): AMMONIA in the last 168 hours. Coagulation Profile: No results for input(s): INR, PROTIME in the last 168 hours. Cardiac Enzymes: No results for input(s): CKTOTAL, CKMB, CKMBINDEX, TROPONINI in the last 168 hours. BNP (last 3 results) No results for input(s): PROBNP in the last 8760 hours. HbA1C: Recent Labs    11/28/18 0323  HGBA1C 11.7*   CBG: Recent Labs  Lab 11/28/18 0337 11/28/18 0422 11/28/18 0501 11/28/18 0729 11/28/18 1149  GLUCAP 67* 138* 165* 237* 348*   Lipid Profile: Recent Labs    11/27/18 2353  CHOL 205*  HDL 24*  LDLCALC UNABLE TO CALCULATE IF TRIGLYCERIDE OVER 400  mg/dL  TRIG 463*  CHOLHDL 8.5   Thyroid Function Tests: Recent Labs    11/27/18 2353  TSH 3.741   Anemia Panel: No results for input(s): VITAMINB12, FOLATE, FERRITIN, TIBC, IRON, RETICCTPCT in the last 72 hours. Sepsis Labs: No results for input(s): PROCALCITON, LATICACIDVEN in the last 168 hours.  Recent Results (from the past 240 hour(s))  SARS Coronavirus 2 (Hosp order,Performed in [facility] lab via [employer] ID)     Status: None   Collection Time: 11/27/18  5:30 PM  Result Value Ref Range Status   SARS Coronavirus 2 ([employer] ID Now) NEGATIVE NEGATIVE Final    Comment: (NOTE) Interpretive Result Comment(s): COVID 19 Positive SARS CoV 2 target nucleic acids are DETECTED. The SARS CoV 2 RNA is generally detectable in upper and lower respiratory specimens during the acute phase of infection.  Positive results are indicative of active infection with SARS CoV 2.  Clinical correlation with patient history and other diagnostic information is necessary to determine patient infection status.  Positive results do not rule out bacterial infection or coinfection with other viruses. The expected result is Negative. COVID 19 Negative SARS CoV 2 target nucleic acids are NOT DETECTED. The SARS CoV 2 RNA is generally detectable in upper and lower respiratory specimens during the acute phase of infection.  Negative results do not preclude SARS CoV 2 infection, do not rule out coinfections with other pathogens, and should not be used as the sole basis for treatment or other patient management decisions.  Negative results must be combined with clinical  observations, patient history, and epidemiological information. The expected result is Negative. Invalid Presence or absence of SARS CoV 2 nucleic acids cannot be determined. Repeat testing was performed on the submitted specimen and repeated Invalid results were obtained.  If clinically indicated, additional testing on a new specimen with  an alternate test methodology [phone]) is advised.  The SARS CoV 2 RNA is generally detectable in upper and lower respiratory specimens during the acute phase of infection. The expected result is Negative. Fact Sheet for Patients:  GolfingFamily.no Fact Sheet for Healthcare Providers: https://www.hernandez-brewer.com/ This test is not yet approved or cleared by the [place] FDA and has been authorized for detection and/or diagnosis of SARS CoV 2 by FDA under an Emergency Use Authorization (EUA).  This EUA will remain in effect (meaning this test can be used) for the duration of the COVID19 d eclaration under Section 564(b)(1)  of the Act, 21 U.S.C. section [phone] 3(b)(1), unless the authorization is terminated or revoked sooner. Performed at [facility], [address] [place], [place] [address]   SARS Coronavirus 2 (CEPHEID - Performed in [facility] hospital lab), Hosp Order     Status: None   Collection Time: 11/27/18 11:20 PM  Result Value Ref Range Status   SARS Coronavirus 2 NEGATIVE NEGATIVE Final    Comment: (NOTE) If result is NEGATIVE SARS-CoV-2 target nucleic acids are NOT DETECTED. The SARS-CoV-2 RNA is generally detectable in upper and lower  respiratory specimens during the acute phase of infection. The lowest  concentration of SARS-CoV-2 viral copies this assay can detect is 250  copies / mL. A negative result does not preclude SARS-CoV-2 infection  and should not be used as the sole basis for treatment or other  patient management decisions.  A negative result may occur with  improper specimen collection / handling, submission of specimen other  than nasopharyngeal swab, presence of viral mutation(s) within the  areas targeted by this assay, and inadequate number of viral copies  (<250 copies / mL). A negative result must be combined with clinical  observations, patient history, and epidemiological information. If  result is POSITIVE SARS-CoV-2 target nucleic acids are DETECTED. The SARS-CoV-2 RNA is generally detectable in upper and lower  respiratory specimens dur ing the acute phase of infection.  Positive  results are indicative of active infection with SARS-CoV-2.  Clinical  correlation with patient history and other diagnostic information is  necessary to determine patient infection status.  Positive results do  not rule out bacterial infection or co-infection with other viruses. If result is PRESUMPTIVE POSTIVE SARS-CoV-2 nucleic acids MAY BE PRESENT.   A presumptive positive result was obtained on the submitted specimen  and confirmed on repeat testing.  While 2019 novel coronavirus  (SARS-CoV-2) nucleic acids may be present in the submitted sample  additional confirmatory testing may be necessary for epidemiological  and / or clinical management purposes  to differentiate between  SARS-CoV-2 and other Sarbecovirus currently known to infect humans.  If clinically indicated additional testing with an alternate test  methodology [phone]) is advised. The SARS-CoV-2 RNA is generally  detectable in upper and lower respiratory sp ecimens during the acute  phase of infection. The expected result is Negative. Fact Sheet for Patients:  StrictlyIdeas.no Fact Sheet for Healthcare Providers: BankingDealers.co.za This test is not yet approved or cleared by the [place] FDA and has been authorized for detection and/or diagnosis of SARS-CoV-2 by FDA under an Emergency Use Authorization (EUA).  This EUA will remain in effect (meaning this test can be used) for the duration of the COVID-19 declaration under Section 564(b)(1) of the Act, 21 U.S.C. section 360bbb-3(b)(1), unless the authorization is terminated or revoked sooner. Performed at [facility], [place] [address] [place], [place] [address]     Radiology Studies: Dg Chest  Portable 1 View  Result Date: 11/27/2018 CLINICAL DATA:  Blurred vision and dizziness.  Diabetic ketoacidosis EXAM: PORTABLE CHEST 1 VIEW COMPARISON:  May 27, 2015. FINDINGS: No edema or consolidation. Heart size and pulmonary vascularity are within normal limits. No adenopathy. No bone lesions. IMPRESSION: No edema or consolidation. Electronically Signed   By: Lowella Grip III M.D.   On: 11/27/2018 19:26   Scheduled Meds: . atorvastatin  40 mg Oral q1800  . enoxaparin (LOVENOX) injection  40 mg Subcutaneous QHS  . insulin aspart  0-5 Units Subcutaneous  QHS  . insulin aspart  0-9 Units Subcutaneous TID WC  . insulin aspart  3 Units Subcutaneous TID WC  . lisinopril  20 mg Oral Daily  . living well with diabetes book   Does not apply Once  . potassium chloride  40 mEq Oral BID   Continuous Infusions: . sodium chloride 75 mL/hr at 11/28/18 2992    LOS: 1 day   Kerney Elbe, DO [facility] PAGER is on AMION  If 7PM-7AM, please contact night-coverage [email or web address] Password [facility] 11/28/2018, 12:33 PM

## 2018-11-28 NOTE — Progress Notes (Signed)
Inpatient Diabetes Program Recommendations  AACE/ADA: New Consensus Statement on Inpatient Glycemic Control (2015)  Target Ranges:  Prepandial:   less than 140 mg/dL      Peak postprandial:   less than 180 mg/dL (1-2 hours)      Critically ill patients:  140 - 180 mg/dL   Lab Results  Component Value Date   GLUCAP 348 (H) 11/28/2018   HGBA1C 11.7 (H) 11/28/2018    Review of Glycemic Control  Diabetes history: New DM Diagnosis this admission  Current orders for Inpatient glycemic control:  Novolog 0-9 units tid Novolog 0-5 units qhs Novolog 3 units tid meal coverage  Patient with no PCP Patient with no medication coverage with insurance, Will enroll this November  Inpatient Diabetes Program Recommendations:    Spoke with Dr. Alfredia Ferguson about plan of care. Insulin orders adjusted  Patient will need ReliOn Novolin 70/30 insulin at discharge. Referred patient to get DM supplies from [facility].  Spoke with patient over the phone extensively in regards to new DM diagnosis.  Discussed A1C results (11.7%) and explained what an A1C is. Discussed basic pathophysiology of DM Type 2, basic home care, importance of checking CBGs and maintaining good CBG control to prevent long-term and short-term complications. Reviewed glucose and A1C goals.  Reviewed signs and symptoms of hyperglycemia and hypoglycemia along with treatment for both. Discussed impact of nutrition, exercise, stress, sickness, and medications on diabetes control.  Discussed in detail carbohydrates, carbohydrate goals per day and meal, along with portion sizes. Reviewed Living Well with diabetes booklet and encouraged patient to read through entire book. Informed patient that he will be prescribed Novolin 70/30 since it is more affordable via insulin pen. Informed patient that Novolin 70/30 can be purchased at [facility] for $43 per box of insulin pens. Provided patient with handout information on Reli-On products in AVS and encouraged  patient to go to [employer] to get the Glucose meter and suplies.   Discussed 70/30 insulin in detail (how to take it, when to take it) and instructed patient he would begin taking it today with supper.   Asked patient to check his glucose at least 2 times per day (fasting and alternating second check) and to keep a log book of glucose readings and insulin taken.  RN to assist patient in insulin injection and CBG check.   Patient verbalized understanding of information discussed and he states that he has no further questions at this time related to diabetes.   RNs to provide ongoing basic DM education at bedside with this patient and engage patient to actively check blood glucose and administer insulin injections.   D/C order numbers Novolin ReliOn 70/30 Flexpen Insulin Pen (Order # [id number])  Will need to get pen needles, glucose meter, glucose tablets, and alcohol pads over the counter.  Thanks, Tama Headings RN, MSN, BC-ADM Inpatient Diabetes Coordinator Team Pager [phone] (8a-5p)

## 2018-11-29 LAB — CBC WITH DIFFERENTIAL/PLATELET
Abs Immature Granulocytes: 0.03 10*3/uL (ref 0.00–0.07)
Basophils Absolute: 0.1 10*3/uL (ref 0.0–0.1)
Basophils Relative: 1 %
Eosinophils Absolute: 0.2 10*3/uL (ref 0.0–0.5)
Eosinophils Relative: 3 %
HCT: 44.8 % (ref 39.0–52.0)
Hemoglobin: 15.8 g/dL (ref 13.0–17.0)
Immature Granulocytes: 1 %
Lymphocytes Relative: 14 %
Lymphs Abs: 0.9 10*3/uL (ref 0.7–4.0)
MCH: 32 pg (ref 26.0–34.0)
MCHC: 35.3 g/dL (ref 30.0–36.0)
MCV: 90.7 fL (ref 80.0–100.0)
Monocytes Absolute: 0.3 10*3/uL (ref 0.1–1.0)
Monocytes Relative: 5 %
Neutro Abs: 4.9 10*3/uL (ref 1.7–7.7)
Neutrophils Relative %: 76 %
Platelets: 188 10*3/uL (ref 150–400)
RBC: 4.94 MIL/uL (ref 4.22–5.81)
RDW: 12.3 % (ref 11.5–15.5)
WBC: 6.4 10*3/uL (ref 4.0–10.5)
nRBC: 0 % (ref 0.0–0.2)

## 2018-11-29 LAB — COMPREHENSIVE METABOLIC PANEL
ALT: 18 U/L (ref 0–44)
AST: 16 U/L (ref 15–41)
Albumin: 3.6 g/dL (ref 3.5–5.0)
Alkaline Phosphatase: 115 U/L (ref 38–126)
Anion gap: 6 (ref 5–15)
BUN: 18 mg/dL (ref 8–23)
CO2: 22 mmol/L (ref 22–32)
Calcium: 8.5 mg/dL — ABNORMAL LOW (ref 8.9–10.3)
Chloride: 105 mmol/L (ref 98–111)
Creatinine, Ser: 0.9 mg/dL (ref 0.61–1.24)
GFR calc Af Amer: >60 mL/min (ref >60)
GFR calc non Af Amer: >60 mL/min (ref >60)
Glucose, Bld: 374 mg/dL — ABNORMAL HIGH (ref 70–99)
Potassium: 4.5 mmol/L (ref 3.5–5.1)
Sodium: 133 mmol/L — ABNORMAL LOW (ref 135–145)
Total Bilirubin: 0.7 mg/dL (ref 0.3–1.2)
Total Protein: 6.1 g/dL — ABNORMAL LOW (ref 6.5–8.1)

## 2018-11-29 LAB — URINE CULTURE: Culture: <10000 — AB

## 2018-11-29 LAB — PHOSPHORUS: Phosphorus: 2.8 mg/dL (ref 2.5–4.6)

## 2018-11-29 LAB — GLUCOSE, CAPILLARY
Glucose-Capillary: 248 mg/dL — ABNORMAL HIGH (ref 70–99)
Glucose-Capillary: 179 mg/dL — ABNORMAL HIGH (ref 70–99)
Glucose-Capillary: 376 mg/dL — ABNORMAL HIGH (ref 70–99)

## 2018-11-29 LAB — MAGNESIUM: Magnesium: 2.1 mg/dL (ref 1.7–2.4)

## 2018-11-29 MED ORDER — INSULIN PEN NEEDLE 32G X 4 MM MISC: 1.0000 | Freq: Two times a day (BID) | 0 refills | Status: AC

## 2018-11-29 MED ORDER — FENOFIBRATE 54 MG PO TABS: 54.0000 mg | ORAL_TABLET | Freq: Every day | ORAL | 0 refills | Status: AC

## 2018-11-29 MED ORDER — INSULIN ISOPHANE & REGULAR (HUMAN 70-30)100 UNIT/ML KWIKPEN: 16.0000 [IU] | PEN_INJECTOR | Freq: Two times a day (BID) | SUBCUTANEOUS | 0 refills | Status: AC

## 2018-11-29 MED ORDER — INSULIN SYRINGES (DISPOSABLE) U-100 0.5 ML MISC: 1.0000 | Freq: Two times a day (BID) | 0 refills | Status: AC

## 2018-11-29 MED ORDER — BLOOD GLUCOSE MONITOR KIT: PACK | 0 refills | Status: AC

## 2018-11-29 MED ORDER — INSULIN ASPART PROT & ASPART (70-30 MIX) 100 UNIT/ML ~~LOC~~ SUSP
16.0000 [IU] | Freq: Two times a day (BID) | SUBCUTANEOUS | Status: DC
  Filled 2018-11-29: qty 10

## 2018-11-29 MED ORDER — LISINOPRIL 20 MG PO TABS: 20.0000 mg | ORAL_TABLET | Freq: Every day | ORAL | 0 refills | Status: AC

## 2018-11-29 MED ORDER — ATORVASTATIN CALCIUM 40 MG PO TABS: 40.0000 mg | ORAL_TABLET | Freq: Every day | ORAL | 0 refills | Status: AC

## 2018-11-29 NOTE — TOC Progression Note (Signed)
Transition of Care [facility]) - Progression Note    Patient Details  Name: Jesus Fitzpatrick MRN: [id number] Date of Birth: 1950-11-24  Transition of Care [facility]) CM/SW Contact  Joaquin Courts, RN Phone Number: 11/29/2018, 10:47 AM  Clinical Narrative:    Gail info placed on AVS for pcp needs.         Expected Discharge Plan and Services           Expected Discharge Date: 11/30/18                                     Social Determinants of Health (SDOH) Interventions    Readmission Risk Interventions No flowsheet data found.

## 2018-11-29 NOTE — Discharge Summary (Signed)
Physician Discharge Summary  Jesus Fitzpatrick JKK:[id number] DOB: [birth year] DOA: 11/27/2018  PCP: Jesus Fitzpatrick  Admit date: 11/27/2018 Discharge date: 11/29/2018  Admitted From: Home Disposition: Home  Recommendations for Outpatient Follow-up:  1. Follow up with PCP in 1-2 weeks 2. Follow up with outpatient [place] and Diabetes Coordinator 3. Please obtain CMP/CBC, Mag, Phos in one week 4. Please follow up on the following pending results:  Home Health: No  Equipment/Devices: None  Discharge Condition: Stable  CODE STATUS: FULL CODE  Diet recommendation: Heart Healthy Carb Modified Diet  Brief/Interim Summary: HPI Fitzpatrick Jesus Fitzpatrick on 11/27/2018 CC: thirst and frequent urination  Jesus Fitzpatrick a 68 y.o.malewith medical history significant forinfrequent medical care presents with above.  [facility]. Says hasn't been diagnosed with htn but has been told bp sometimes elevated. Denies personal or family history of diabetes. Denies toxic habits including alcohol.  Denies recent illness. Denies fever, cough, shortness of breath, chest pain, abdominal pain, nausea, vomiting, or diarrhea. No skin changes  Patient reports that for about a week has developed excessive thirst and dry mouth as well as frequent non-painful urination.  No daily medications.  **Interim History  Blood sugars improved was transitioned to Lantus initially changed this 70 3012 units twice daily and then further adjusted to 16 units twice daily.  Hemoglobin A1c was 1.7 and will have diabetes coordinator evaluate and do diabetic teaching as patient likely will need to go home on insulin.  Patient blood pressure was also elevated so we will start on lisinopril.  Lipid panel also showed elevated cholesterol so we will also start on atorvastatin.  He improved and blood sugars were better controlled.  He was shown how to use his insulin vials and then will switch to pens after  discharge.  He was stable for discharge will need to follow-up with PCP and establish with them within 1 week and was given the number for the health [facility] wellness center.  Discharge Diagnoses:  Principal Problem:   Hyperglycemia Active Problems:   Elevated blood pressure reading  Uncontrolled Hyperglycemia and Mild Diabetic Ketoacidosis in the Setting of New Onset Diabetes Mellitus Type 2; DKA is improved - Had symptomatic Hyperglycemia, gap 15, bicarb mildly low, vbg wnl, ketones in urine.  -Hemodynamically stable, glucose improved from 800s to 300s at [facility] after IV insulin and 4 L NS.  -Was Tachycardic, with mild AKI and now imoproved -No symptoms to suggest infection or other illness as instigator; urinalysis is unremarkable besides glucosuria but no edema or consolidation -Continued DKA pathway (IV insulin, NS for now, K supplementation) -Transitioned to 10 units of Lantus and started on Sensitive Novolog SSI AC/HS -Discussed with Diabetes Education Coordinator and will need Diabetic Teaching with Flex Pens -Will start patient on 70/30 12 Units BID and continue Sensitive Novolog SSI AC/HS; 70/30 changed to 16 units BID for D/C -HbA1c was 11.7 -TSH was 3.741 -Will need Diabetic Foot Exam and Retinopathy Screen as an outpatient  -Continue to Monitor CBG's and ranging from 179-376  Elevated Blood Pressure/Essential Hypertension  -Has had spotty primary care, told elevated bp in past, suspect this represents chronic HTN. Asymptomatic. -BP remained elevated and last reading was -Will start Lisinopril 20 mg po Daily and continue  -Continue to Monitor Vital Signs Fitzpatrick Protocol  Hyperlipidemia/Dyslipidemia  -Patient is total cholesterol/HDL ratio 8.5, cholesterol level of 205, HDL 24, LDL was unable to be calculated, triglycerides were 463, and VLDL is unable to  be calculated -We will start Atorvastatin 40 mg p.o. nightly along with Fenofibrate 54 mg po Daily   AKI, improved  -In  the setting of Dehydration from DKA -Studies done and showed straw-colored urine with greater than 500 glucose, rare bacteria, 0-5 WBCs, and negative protein; urine culture still pending -Urine protein total was 20 and protein creatinine ratio 0.21 and urine creatinine is 96.94 -Given IVF with NS and received 4 Liter Boluses -Stopped IVF today -BUN/Cr is now 18/0.90 -Continue to Monitor and Trend Renal Fxn -Repeat CMP in AM  Hyponatremia -In the setting of Uncontrolled Hyperglycemia -Patient's IVF with NS as now stopped -Repeat CMP this AM showed Na+ of 133 and Glucose of 374 -Continue to Monitor and Trend -Repeat CMP as an outpatient   Metabolic Acidosis -Mild. DKA is improved -IVF sotpped -CO2 is now 53 and AG is 6 -Continue to Monitor and Repeat CMP as an outpatient   Discharge Instructions  Discharge Instructions    Ambulatory referral to Nutrition and Diabetic Education   Complete by:  As directed    Diet - low sodium heart healthy   Complete by:  As directed    Increase activity slowly   Complete by:  As directed      Allergies as of 11/29/2018   No Known Allergies     Medication List    STOP taking these medications   HYDROcodone-acetaminophen 5-325 MG tablet Commonly known as:  NORCO/VICODIN   levETIRAcetam 500 MG tablet Commonly known as:  KEPPRA     TAKE these medications   atorvastatin 40 MG tablet Commonly known as:  LIPITOR Take 1 tablet (40 mg total) by mouth daily at 6 PM.   blood glucose meter kit and supplies Kit Dispense based on patient and insurance preference. Use up to four times daily as directed. (FOR ICD-9 250.00, 250.01).   fenofibrate 54 MG tablet Take 1 tablet (54 mg total) by mouth daily. Start taking on:  Nov 30, 2018   FISH OIL PO Take 1 capsule by mouth daily.   glucosamine-chondroitin 500-400 MG tablet Take 1 tablet by mouth 3 (three) times daily.   Insulin Isophane & Regular Human (70-30) 100 UNIT/ML PEN Commonly  known as:  NovoLIN 70/30 FlexPen Relion Inject 16 Units into the skin 2 (two) times daily.   Insulin Pen Needle 32G X 4 MM Misc 1 Container by Does not apply route 2 (two) times a day.   Insulin Syringes (Disposable) U-100 0.5 ML Misc 1 Container by Does not apply route 2 (two) times a day.   lisinopril 20 MG tablet Commonly known as:  ZESTRIL Take 1 tablet (20 mg total) by mouth daily. Start taking on:  Nov 30, 2018   multivitamin with minerals Tabs tablet Take 1 tablet by mouth daily.   omeprazole 40 MG capsule Commonly known as:  PRILOSEC Take 40 mg by mouth daily.      Follow-up [facility] Follow up.   Why:  Call for follow up appointment to establish primary doctor Contact information: [address] [id number] [phone]         No Known Allergies  Consultations:  Diabetes Education Coordinator  Procedures/Studies: Dg Chest Portable 1 View  Result Date: 11/27/2018 CLINICAL DATA:  Blurred vision and dizziness.  Diabetic ketoacidosis EXAM: PORTABLE CHEST 1 VIEW COMPARISON:  May 27, 2015. FINDINGS: No edema or consolidation. Heart size and pulmonary vascularity are within normal limits.  No adenopathy. No bone lesions. IMPRESSION: No edema or consolidation. Electronically Signed   By: Lowella Grip III M.D.   On: 11/27/2018 19:26     Subjective: Seen and examined at bedside and is doing well.  Denies chest pain, lightheadedness or dizziness.  No nausea or vomiting.  Symptoms improved.  No other concerns complaints at this time is ready to go home.  Discharge Exam: Vitals:   11/28/18 2104 11/29/18 0520  BP: 130/81 118/76  Pulse: 69 69  Resp: 19 18  Temp: 98.1 F (36.7 C) 98 F (36.7 C)  SpO2: 95% 99%   Vitals:   11/28/18 1400 11/28/18 1616 11/28/18 2104 11/29/18 0520  BP: (!) 120/53 133/82 130/81 118/76  Pulse: 75 66 69 69  Resp: _0 Temp:  97.7 F (36.5  C) 98.1 F (36.7 C) 98 F (36.7 C)  TempSrc:  Oral Oral Oral  SpO2: 94% 95% 95% 99%  Weight:  80.1 kg  82.3 kg  Height:       General: Pt is alert, awake, not in acute distress Cardiovascular: RRR, S1/S2 +, no rubs, no gallops Respiratory: CTA bilaterally, no wheezing, no rhonchi Abdominal: Soft, NT, ND, bowel sounds + Extremities: no edema, no cyanosis  The results of significant diagnostics from this hospitalization (including imaging, microbiology, ancillary and laboratory) are listed below for reference.    Microbiology: Recent Results (from the past 240 hour(s))  SARS Coronavirus 2 (Hosp order,Performed in [facility] lab via Abbott ID)     Status: None   Collection Time: 11/27/18  5:30 PM  Result Value Ref Range Status   SARS Coronavirus 2 (Abbott ID Now) NEGATIVE NEGATIVE Final    Comment: (NOTE) Interpretive Result Comment(s): COVID 19 Positive SARS CoV 2 target nucleic acids are DETECTED. The SARS CoV 2 RNA is generally detectable in upper and lower respiratory specimens during the acute phase of infection.  Positive results are indicative of active infection with SARS CoV 2.  Clinical correlation with patient history and other diagnostic information is necessary to determine patient infection status.  Positive results do not rule out bacterial infection or coinfection with other viruses. The expected result is Negative. COVID 19 Negative SARS CoV 2 target nucleic acids are NOT DETECTED. The SARS CoV 2 RNA is generally detectable in upper and lower respiratory specimens during the acute phase of infection.  Negative results do not preclude SARS CoV 2 infection, do not rule out coinfections with other pathogens, and should not be used as the sole basis for treatment or other patient management decisions.  Negative results must be combined with clinical  observations, patient history, and epidemiological information. The expected result is  Negative. Invalid Presence or absence of SARS CoV 2 nucleic acids cannot be determined. Repeat testing was performed on the submitted specimen and repeated Invalid results were obtained.  If clinically indicated, additional testing on a new specimen with an alternate test methodology [phone]) is advised.  The SARS CoV 2 RNA is generally detectable in upper and lower respiratory specimens during the acute phase of infection. The expected result is Negative. Fact Sheet for Patients:  GolfingFamily.no Fact Sheet for Healthcare Providers: https://www.hernandez-brewer.com/ This test is not yet approved or cleared by the [place] FDA and has been authorized for detection and/or diagnosis of SARS CoV 2 by FDA under an Emergency Use Authorization (EUA).  This EUA will remain in effect (meaning this test can be used) for the duration of the COVID19 d  eclaration under Section 564(b)(1) of the Act, 21 U.S.C. section [phone] 3(b)(1), unless the authorization is terminated or revoked sooner. Performed at [facility], [address] [place], [place] [address]   Urine culture     Status: Abnormal   Collection Time: 11/27/18  5:45 PM  Result Value Ref Range Status   Specimen Description   Final    URINE, RANDOM Performed at [facility], [address] [place], [place] [address]    Special Requests   Final    NONE Performed at [facility], [address] [place], [place] [address]    Culture (A)  Final    <10,000 COLONIES/mL INSIGNIFICANT GROWTH Performed at [facility], [place] [address] [place], [place] [address]    Report Status 11/29/2018 FINAL  Final  SARS Coronavirus 2 (CEPHEID - Performed in [facility] hospital lab), Hosp Order     Status: None   Collection Time: 11/27/18 11:20 PM  Result Value Ref Range Status   SARS Coronavirus 2 NEGATIVE NEGATIVE Final    Comment: (NOTE) If result is  NEGATIVE SARS-CoV-2 target nucleic acids are NOT DETECTED. The SARS-CoV-2 RNA is generally detectable in upper and lower  respiratory specimens during the acute phase of infection. The lowest  concentration of SARS-CoV-2 viral copies this assay can detect is 250  copies / mL. A negative result does not preclude SARS-CoV-2 infection  and should not be used as the sole basis for treatment or other  patient management decisions.  A negative result may occur with  improper specimen collection / handling, submission of specimen other  than nasopharyngeal swab, presence of viral mutation(s) within the  areas targeted by this assay, and inadequate number of viral copies  (<250 copies / mL). A negative result must be combined with clinical  observations, patient history, and epidemiological information. If result is POSITIVE SARS-CoV-2 target nucleic acids are DETECTED. The SARS-CoV-2 RNA is generally detectable in upper and lower  respiratory specimens dur ing the acute phase of infection.  Positive  results are indicative of active infection with SARS-CoV-2.  Clinical  correlation with patient history and other diagnostic information is  necessary to determine patient infection status.  Positive results do  not rule out bacterial infection or co-infection with other viruses. If result is PRESUMPTIVE POSTIVE SARS-CoV-2 nucleic acids MAY BE PRESENT.   A presumptive positive result was obtained on the submitted specimen  and confirmed on repeat testing.  While 2019 novel coronavirus  (SARS-CoV-2) nucleic acids may be present in the submitted sample  additional confirmatory testing may be necessary for epidemiological  and / or clinical management purposes  to differentiate between  SARS-CoV-2 and other Sarbecovirus currently known to infect humans.  If clinically indicated additional testing with an alternate test  methodology [phone]) is advised. The SARS-CoV-2 RNA is generally  detectable  in upper and lower respiratory sp ecimens during the acute  phase of infection. The expected result is Negative. Fact Sheet for Patients:  StrictlyIdeas.no Fact Sheet for Healthcare Providers: BankingDealers.co.za This test is not yet approved or cleared by the [place] FDA and has been authorized for detection and/or diagnosis of SARS-CoV-2 by FDA under an Emergency Use Authorization (EUA).  This EUA will remain in effect (meaning this test can be used) for the duration of the COVID-19 declaration under Section 564(b)(1) of the Act, 21 U.S.C. section 360bbb-3(b)(1), unless the authorization is terminated or revoked sooner. Performed  at [facility], [place] [address] [place], [place] [address]     Labs: BNP (last 3 results) No results for input(s): BNP in the last 8760 hours. Basic Metabolic Panel: Recent Labs  Lab 11/27/18 2353 11/28/18 0323 11/28/18 0804 11/28/18 1304 11/29/18 1039  NA 140 142  143 139 132* 133*  K 3.6 3.5  3.4* 3.9 4.1 4.5  CL 110 112*  112* 108 107 105  CO2 _0 21* 22  GLUCOSE 227* 77  78 243* 342* 374*  BUN _1 CREATININE 0.97 0.91  0.94 0.86 0.76 0.90  CALCIUM 8.4* 8.2*  8.2* 7.8* 7.7* 8.5*  MG  --   --  2.2  --  2.1  PHOS  --   --  3.0  --  2.8   Liver Function Tests: Recent Labs  Lab 11/27/18 1707 11/28/18 0804 11/29/18 1039  AST 14* 16 16  ALT _2 ALKPHOS 152* 109 115  BILITOT 1.0 0.3 0.7  PROT 7.1 5.8* 6.1*  ALBUMIN 4.5 3.5 3.6   No results for input(s): LIPASE, AMYLASE in the last 168 hours. No results for input(s): AMMONIA in the last 168 hours. CBC: Recent Labs  Lab 11/27/18 1707 11/27/18 1741 11/27/18 2353 11/28/18 0804 11/29/18 1039  WBC 8.5  --  9.1 8.0 6.4  NEUTROABS 6.8  --   --  5.5 4.9  HGB 16.7 16.7 15.1 14.6 15.8  HCT 49.5 49.0 43.8 43.2 44.8  MCV 92.2  --  92.4 92.5 90.7  PLT 237  --  217 197 188    Cardiac Enzymes: No results for input(s): CKTOTAL, CKMB, CKMBINDEX, TROPONINI in the last 168 hours. BNP: Invalid input(s): POCBNP CBG: Recent Labs  Lab 11/28/18 1625 11/28/18 2102 11/29/18 0816 11/29/18 1149 11/29/18 1157  GLUCAP 270* 194* 248* 376* 179*   D-Dimer No results for input(s): DDIMER in the last 72 hours. Hgb A1c Recent Labs    11/28/18 0323  HGBA1C 11.7*   Lipid Profile Recent Labs    11/27/18 2353  CHOL 205*  HDL 24*  LDLCALC UNABLE TO CALCULATE IF TRIGLYCERIDE OVER 400 mg/dL  TRIG 463*  CHOLHDL 8.5  LDLDIRECT 85.8   Thyroid function studies Recent Labs    11/27/18 2353  TSH 3.741   Anemia work up No results for input(s): VITAMINB12, FOLATE, FERRITIN, TIBC, IRON, RETICCTPCT in the last 72 hours. Urinalysis    Component Value Date/Time   COLORURINE STRAW (A) 11/27/2018 1745   APPEARANCEUR CLEAR 11/27/2018 1745   LABSPEC 1.010 11/27/2018 1745   PHURINE 5.5 11/27/2018 1745   GLUCOSEU >=500 (A) 11/27/2018 1745   HGBUR NEGATIVE 11/27/2018 1745   BILIRUBINUR NEGATIVE 11/27/2018 1745   KETONESUR 40 (A) 11/27/2018 1745   PROTEINUR NEGATIVE 11/27/2018 1745   NITRITE NEGATIVE 11/27/2018 1745   LEUKOCYTESUR NEGATIVE 11/27/2018 1745   Sepsis Labs Invalid input(s): PROCALCITONIN,  WBC,  LACTICIDVEN Microbiology Recent Results (from the past 240 hour(s))  SARS Coronavirus 2 (Hosp order,Performed in [facility] lab via [employer] ID)     Status: None   Collection Time: 11/27/18  5:30 PM  Result Value Ref Range Status   SARS Coronavirus 2 (Abbott ID Now) NEGATIVE NEGATIVE Final    Comment: (NOTE) Interpretive Result Comment(s): COVID 19 Positive SARS CoV 2 target nucleic acids are DETECTED. The SARS CoV 2 RNA is generally detectable in upper and lower respiratory specimens during the acute phase of infection.  Positive results are indicative of active infection with SARS CoV 2.  Clinical correlation with patient history and other diagnostic  information is necessary to determine patient infection status.  Positive results do not rule out bacterial infection or coinfection with other viruses. The expected result is Negative. COVID 19 Negative SARS CoV 2 target nucleic acids are NOT DETECTED. The SARS CoV 2 RNA is generally detectable in upper and lower respiratory specimens during the acute phase of infection.  Negative results do not preclude SARS CoV 2 infection, do not rule out coinfections with other pathogens, and should not be used as the sole basis for treatment or other patient management decisions.  Negative results must be combined with clinical  observations, patient history, and epidemiological information. The expected result is Negative. Invalid Presence or absence of SARS CoV 2 nucleic acids cannot be determined. Repeat testing was performed on the submitted specimen and repeated Invalid results were obtained.  If clinically indicated, additional testing on a new specimen with an alternate test methodology [phone]) is advised.  The SARS CoV 2 RNA is generally detectable in upper and lower respiratory specimens during the acute phase of infection. The expected result is Negative. Fact Sheet for Patients:  GolfingFamily.no Fact Sheet for Healthcare Providers: https://www.hernandez-brewer.com/ This test is not yet approved or cleared by the [place] FDA and has been authorized for detection and/or diagnosis of SARS CoV 2 by FDA under an Emergency Use Authorization (EUA).  This EUA will remain in effect (meaning this test can be used) for the duration of the COVID19 d eclaration under Section 564(b)(1) of the Act, 21 U.S.C. section [phone] 3(b)(1), unless the authorization is terminated or revoked sooner. Performed at [facility], [address], [place], [place] [address]   Urine culture     Status: Abnormal   Collection Time: 11/27/18  5:45 PM  Result  Value Ref Range Status   Specimen Description   Final    URINE, RANDOM Performed at [facility], [address] [place], [place] [address]    Special Requests   Final    NONE Performed at [facility], [address] [place], [place] [address]    Culture (A)  Final    <10,000 COLONIES/mL INSIGNIFICANT GROWTH Performed at [facility], [place] [address], [place], [place] [address]    Report Status 11/29/2018 FINAL  Final  SARS Coronavirus 2 (CEPHEID - Performed in [place] hospital lab), Hosp Order     Status: None   Collection Time: 11/27/18 11:20 PM  Result Value Ref Range Status   SARS Coronavirus 2 NEGATIVE NEGATIVE Final    Comment: (NOTE) If result is NEGATIVE SARS-CoV-2 target nucleic acids are NOT DETECTED. The SARS-CoV-2 RNA is generally detectable in upper and lower  respiratory specimens during the acute phase of infection. The lowest  concentration of SARS-CoV-2 viral copies this assay can detect is 250  copies / mL. A negative result does not preclude SARS-CoV-2 infection  and should not be used as the sole basis for treatment or other  patient management decisions.  A negative result may occur with  improper specimen collection / handling, submission of specimen other  than nasopharyngeal swab, presence of viral mutation(s) within the  areas targeted by this assay, and inadequate number of viral copies  (<250 copies / mL). A negative result must be combined with clinical  observations, patient history, and epidemiological information. If result is POSITIVE  SARS-CoV-2 target nucleic acids are DETECTED. The SARS-CoV-2 RNA is generally detectable in upper and lower  respiratory specimens dur ing the acute phase of infection.  Positive  results are indicative of active infection with SARS-CoV-2.  Clinical  correlation with patient history and other diagnostic information is  necessary to determine patient infection status.  Positive  results do  not rule out bacterial infection or co-infection with other viruses. If result is PRESUMPTIVE POSTIVE SARS-CoV-2 nucleic acids MAY BE PRESENT.   A presumptive positive result was obtained on the submitted specimen  and confirmed on repeat testing.  While 2019 novel coronavirus  (SARS-CoV-2) nucleic acids may be present in the submitted sample  additional confirmatory testing may be necessary for epidemiological  and / or clinical management purposes  to differentiate between  SARS-CoV-2 and other Sarbecovirus currently known to infect humans.  If clinically indicated additional testing with an alternate test  methodology [phone]) is advised. The SARS-CoV-2 RNA is generally  detectable in upper and lower respiratory sp ecimens during the acute  phase of infection. The expected result is Negative. Fact Sheet for Patients:  StrictlyIdeas.no Fact Sheet for Healthcare Providers: BankingDealers.co.za This test is not yet approved or cleared by the [place] FDA and has been authorized for detection and/or diagnosis of SARS-CoV-2 by FDA under an Emergency Use Authorization (EUA).  This EUA will remain in effect (meaning this test can be used) for the duration of the COVID-19 declaration under Section 564(b)(1) of the Act, 21 U.S.C. section 360bbb-3(b)(1), unless the authorization is terminated or revoked sooner. Performed at [facility], [place] [address], [place], [place] [address]    Time coordinating discharge: 35 minutes  SIGNED:  Kerney Elbe, DO [facility] 11/29/2018, 8:33 PM Pager is on [place]  If 7PM-7AM, please contact night-coverage [email or web address] Password [email or web address]

## 2018-11-29 NOTE — Plan of Care (Signed)
Discharge instructions reviewed with patient, questions answered, verbalized understanding.  Extensive education with patient regarding administration of 70/30 insulin 16 units twice daily.  Patient able to demonstrate how to pull up 16 units of insulin and administer this in his abdomen.  Discussed the importance of monitoring his glucose levels at least 3 times a day (fasting, before supper and before bedtime) and keeping a record of these to take to his PCP.  Patient ambulatory with RN to main entrance to be taken home by friend.  Patient left facility with education handouts and living well with diabetes book.

## 2018-11-29 NOTE — Progress Notes (Signed)
Inpatient Diabetes Program Recommendations  AACE/ADA: New Consensus Statement on Inpatient Glycemic Control (2015)  Target Ranges:  Prepandial:   less than 140 mg/dL      Peak postprandial:   less than 180 mg/dL (1-2 hours)      Critically ill patients:  140 - 180 mg/dL   Lab Results  Component Value Date   GLUCAP 248 (H) 11/29/2018   HGBA1C 11.7 (H) 11/28/2018    Review of Glycemic Control  Diabetes history: New DM Diagnosis this admission  Current orders for Inpatient glycemic control:  Novolog 0-9 units tid Novolog 0-5 units qhs Novolog 3 units tid meal coverage  Patient with no PCP Patient with no medication coverage with insurance, Will enroll this November  Inpatient Diabetes Program Recommendations:     Glucose trends still elevated. Consider increasing 70/30 dose to 16 units BID.  Patient has 70/30 vial from the hospital from this admission he can be discharged with. RN has assisted pt in drawing up and administering insulin. Patient also return demonstrated.   D/C order numbers  Syringe prescription order for Vial order #  [id number] Novolin ReliOn 70/30 Flexpen Insulin Pen (Order # [id number]) Pen needle order # [id number]  Will need to get glucose meter, glucose tablets, and alcohol pads over the counter.  Thanks, Tama Headings RN, MSN, BC-ADM Inpatient Diabetes Coordinator Team Pager [phone] (8a-5p)

## 2018-12-01 LAB — HCV INTERPRETATION

## 2018-12-01 LAB — HCV AB W REFLEX TO QUANT PCR: HCV Ab: <0.1 s/co ratio (ref 0.0–0.9)

## 2018-12-14 DIAGNOSIS — E1165 Type 2 diabetes mellitus with hyperglycemia: Secondary | ICD-10-CM | POA: Diagnosis not present

## 2018-12-14 DIAGNOSIS — E139 Other specified diabetes mellitus without complications: Secondary | ICD-10-CM | POA: Diagnosis not present

## 2018-12-23 ENCOUNTER — Ambulatory Visit: Payer: No Typology Code available for payment source | Admitting: *Deleted

## 2018-12-30 ENCOUNTER — Encounter: Payer: Medicare Other | Attending: Nurse Practitioner | Admitting: Registered"

## 2018-12-30 ENCOUNTER — Other Ambulatory Visit: Payer: Self-pay

## 2018-12-30 ENCOUNTER — Encounter: Payer: Self-pay | Admitting: Registered"

## 2018-12-30 DIAGNOSIS — E119 Type 2 diabetes mellitus without complications: Secondary | ICD-10-CM | POA: Diagnosis not present

## 2018-12-30 NOTE — Patient Instructions (Addendum)
Because you are taking Lisinopril avoid salt substitute that includes potassium. Consider talking to your doctor about metformin for insulin resistance. Review handout for foot care. Consider including more structured exercise, moderate intensity 3-5x/week Aim to eat balanced meals and snacks. Check Blood sugar 2-3x/day once fasting and 2 hrs after 1 or 2 meals.

## 2018-12-30 NOTE — Progress Notes (Signed)
Patient states his PCP, Jesus Fitzpatrick from [facility] is who referred him to this office. In computer system there was a referral from hospital encounter only, but RD will send notes to PCP per patient's request.  Diabetes Self-Management Education  Visit Type: First/Initial  Appt. Start Time: 1030 Appt. End Time: 4650  12/30/2018  [name], identified by name and date of birth, is a 68 y.o. male with a diagnosis of Diabetes: Type 2.   ASSESSMENT  There were no vitals taken for this visit. There is no height or weight on file to calculate BMI. Patient states he lost weight with hospital encounter but reports wt has stabilized, may have gained a little back.  Patient states he went to hospital with polyuria and polydipsia, per chart A1c 11.7% with ketoacidosis in problem list. Started insulin during hospital encounter, PCP continued, no additional DM medications. Pt states he called MD office to see what type he had and states he was told he MD thinks it is 1.5.  Pt timing of SMBG was limited to 30 min after meals, log values 150-low 200s mg/dL   Pt states he has podiatrist next month, wasn't clear about foot care, RD provided handout.  Physical Activity: Pt states he wants to get into gym (not open yet d/t COVID. Pt states he is a moderately active, but driving is sedentary, and no structured activity.  Food: Pt has changed eating habits, was drinking soda, eating ice cream. Was also using salt substitute, but states he was not instructed to do so. Patient is interested in making changes, but states it is hard because he cooks for himself. Pt states he is starting to pay more attention to things like saturate vs unsaturated fats and confused why unsaturated is not listed on label. RD did not have time to teach how to read food labels. Pt states he has noticed that hotdogs do not raise his BG.  Lipid labs: TG 463 (Fenofibrate and fish oil), HDL 24 30 days medications from hospital  encounter. 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 Pt states he discontinued Prilosec and vitamins after hospital, was doing well 1 week, but was put back on prilosec again. Pt states he was taking B12 after reading prilosec can cause deficiency.   Ideas for follow-up - sign up for ADA 1-yr program for new dx, meal planning, reading food labels, discussing fats, his lipid panel especially TG & HDL.  Diabetes Self-Management Education - 12/30/18 1045      Visit Information   Visit Type  First/Initial      Initial Visit   Diabetes Type  Type 2    Are you currently following a meal plan?  Yes    What type of meal plan do you follow?  low cholesterol    Are you taking your medications as prescribed?  Yes   Novolin 70/30 2/day   Date Diagnosed  11/2018      Health Coping   How would you rate your overall health?  Good      Psychosocial Assessment   Patient Belief/Attitude about Diabetes  Other (comment)   acceptance   How often do you need to have someone help you when you read instructions, pamphlets, or other written materials from your doctor or pharmacy?  1 - Never    What is the last grade level you completed in school?  12  Complications   Last HgB A1C per patient/outside source  11.7 %    How often do you check your blood sugar?  3-4 times/day    Have you had a dilated eye exam in the past 12 months?  Yes    Have you had a dental exam in the past 12 months?  Yes    Are you checking your feet?  Yes    How many days per week are you checking your feet?  7      Dietary Intake   Breakfast  weekdays cereal OJ, weekends eggs, bacon, grits    Lunch  [employer] no bun, apple    Dinner  Hamburger, broccoli, apple, milk    Beverage(s)  water, OJ, unsweet tea      Exercise   Exercise Type  ADL's    How many days per week to you exercise?  0    How many minutes per day do you exercise?  0    Total minutes per week  of exercise  0      Patient Education   Previous Diabetes Education  No    Nutrition management   Role of diet in the treatment of diabetes and the relationship between the three main macronutrients and blood glucose level;Carbohydrate counting    Physical activity and exercise   Role of exercise on diabetes management, blood pressure control and cardiac health.    Medications  Reviewed patients medication for diabetes, action, purpose, timing of dose and side effects.    Monitoring  Purpose and frequency of SMBG.      Individualized Goals (developed by patient)   Nutrition  General guidelines for healthy choices and portions discussed    Physical Activity  Exercise 3-5 times per week    Monitoring   test my blood glucose as discussed      Outcomes   Expected Outcomes  Demonstrated interest in learning. Expect positive outcomes    Future DMSE  4-6 wks    Program Status  Completed       Individualized Plan for Diabetes Self-Management Training:   Learning Objective:  Patient will have a greater understanding of diabetes self-management. Patient education plan is to attend individual and/or group sessions per assessed needs and concerns.   Patient Instructions  Because you are taking Lisinopril avoid salt substitute that includes potassium. Consider talking to your doctor about metformin for insulin resistance. Review handout for foot care. Consider including more structured exercise, moderate intensity 3-5x/week Aim to eat balanced meals and snacks. Check Blood sugar 2-3x/day once fasting and 2 hrs after 1 or 2 meals.   Expected Outcomes:  Demonstrated interest in learning. Expect positive outcomes  Education material provided: A1C conversion sheet and Carbohydrate counting sheet, Foot Care (Pt had LWw/DM book received while at [facility])   If problems or questions, patient to contact team via:  Phone  Future DSME appointment: 4-6 wks

## 2019-01-15 ENCOUNTER — Ambulatory Visit (INDEPENDENT_AMBULATORY_CARE_PROVIDER_SITE_OTHER): Payer: Medicare Other | Admitting: Podiatry

## 2019-01-15 ENCOUNTER — Other Ambulatory Visit: Payer: Self-pay

## 2019-01-15 ENCOUNTER — Encounter: Payer: Self-pay | Admitting: Podiatry

## 2019-01-15 DIAGNOSIS — E119 Type 2 diabetes mellitus without complications: Secondary | ICD-10-CM | POA: Diagnosis not present

## 2019-01-15 NOTE — Progress Notes (Signed)
This patient presents to the office with chief complaint of  diabetic feet.  He presents to the office for diabetic foot exam.  This patient  says there  is  no pain and discomfort in their feet. .  Patient has no history of infection or drainage from both feet.  P . This patient presents  to the office today for  a foot evaluation due to history of  Diabetes.  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  Patient also says he has occasional problem with outside joint left foot.  General Appearance  Alert, conversant and in no acute stress.  Vascular  Dorsalis pedis and posterior tibial  pulses are palpable  bilaterally.  Capillary return is within normal limits  bilaterally. Temperature is within normal limits  bilaterally.  Neurologic  Senn-Weinstein monofilament wire test within normal limits  bilaterally. Muscle power within normal limits bilaterally.  Nails Normal nails  Both feet  B/L  Orthopedic  No limitations of motion of motion feet .  No crepitus or effusions noted.  No bony pathology or digital deformities noted. Ganglion anterior aspect right hallux.  Hammer toes 2,3  B/l.  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 Skin  normotropic skin with no porokeratosis noted bilaterally.  No signs of infections or ulcers noted.     Onychomycosis  Diabetes with no foot complications  IE    A diabetic foot exam was performed and there is no evidence of any vascular or neurologic pathology.   RTC 1 year.   Gardiner Barefoot DPM

## 2019-01-21 DIAGNOSIS — E119 Type 2 diabetes mellitus without complications: Secondary | ICD-10-CM | POA: Diagnosis not present

## 2019-02-10 ENCOUNTER — Encounter: Payer: Self-pay | Admitting: Registered"

## 2019-02-10 ENCOUNTER — Other Ambulatory Visit: Payer: Self-pay

## 2019-02-10 ENCOUNTER — Encounter: Payer: Medicare Other | Attending: Nurse Practitioner | Admitting: Registered"

## 2019-02-10 DIAGNOSIS — E119 Type 2 diabetes mellitus without complications: Secondary | ICD-10-CM

## 2019-02-10 NOTE — Patient Instructions (Addendum)
Sign up for ADA diabetes program diabetes.[email or web address]/living If you want to check your blood sugar after meals, keep within a 1 1/2 hr - 2 hour time frame to get the best information. Continue to eat balanced meals and snacks and consider adding more seafood into your diet. Consider ways to bring down your stress because it seems to be affecting your blood sugar. Remember to talk to your doctor about: Metformin, being lightheaded when you stand up

## 2019-02-10 NOTE — Progress Notes (Signed)
Diabetes Self-Management Education  Visit Type: Follow-up  Appt. Start Time: 0800 Appt. End Time: 0830  02/10/2019  Jesus Fitzpatrick, identified by name and date of birth, is a 68 y.o. male with a diagnosis of Diabetes: Type 2(1.5?).   ASSESSMENT  There were no vitals taken for this visit. There is no height or weight on file to calculate BMI. Patient is Hard of Hearing. Pt states it is easier to communicate when he can read lips, masks make it difficult.  Pt brought log sheet and has been checking 3x day. Patient PPBG are not consistently within the timeframe suggested. Discussed how to calculate A1c estimate. Patient has had one hypoglycemic number in the last month, 68 mg/dL. Discussed appropriate treatment (not snickers). A couple of the hyperglycemic numbers appear to be d/t stress related to work.  Patient had a question about lisinopril from last visit and was concerned about potassium content of foods. RD explained potassium is an important nutrient and only to avoid the salt substitutes with potassium that can over do it.  Patient was concerned that the medications he is taking for diabetes makes him more susceptible to COVID. RD explained that having diabetes increases his risk of having a more severe case of COVID if he gets it, but his medication does not increase the risk of being infected.  Pt states he has seen podiatrist, everything is good and patient checks his feet daily. Review handout for foot care  Pt states he stays active, but is not doing structured exercise.   Pt also reports he gets light headheaded when he stands up, he thinks it is d/t blood thinners. RD suggested he speak with MD.  Diabetes Self-Management Education - 02/10/19 0910      Visit Information   Visit Type  Follow-up      Initial Visit   Diabetes Type  Type 2   1.5?     Complications   How often do you check your blood sugar?  3-4 times/day    Fasting Blood glucose range (mg/dL)   <70;70-129    Postprandial Blood glucose range (mg/dL)  70-129;130-179    Number of hypoglycemic episodes per month  1    Can you tell when your blood sugar is low?  Yes    What do you do if your blood sugar is low?  glucose tab or snickers    Are you checking your feet?  Yes      Dietary Intake   Breakfast  cereal, 1/2 glass OJ, coffee    Snack (morning)  fruit, nuts    Lunch  cook out bbq    Snack (afternoon)  apple    Dinner  steak, potato    Beverage(s)  water, coffee, OJ      Exercise   Exercise Type  ADL's      Patient Education   Nutrition management   Role of diet in the treatment of diabetes and the relationship between the three main macronutrients and blood glucose level    Monitoring  Identified appropriate SMBG and/or A1C goals.      Individualized Goals (developed by patient)   Nutrition  General guidelines for healthy choices and portions discussed    Monitoring   test my blood glucose as discussed      Patient Self-Evaluation of Goals - Patient rates self as meeting previously set goals (% of time)   Nutrition  >75%    Physical Activity  50 - 75 %  Medications  >75%    Monitoring  >75%      Outcomes   Expected Outcomes  Demonstrated interest in learning. Expect positive outcomes    Future DMSE  PRN    Program Status  Completed      Subsequent Visit   Since your last visit have you continued or begun to take your medications as prescribed?  Yes    Since your last visit have you had your blood pressure checked?  Yes    Is your most recent blood pressure lower, unchanged, or higher since your last visit?  Lower    Since your last visit have you experienced any weight changes?  No change    Since your last visit, are you checking your blood glucose at least once a day?  Yes       Individualized Plan for Diabetes Self-Management Training:   Learning Objective:  Patient will have a greater understanding of diabetes self-management. Patient education plan  is to attend individual and/or group sessions per assessed needs and concerns.   Patient Instructions  Sign up for ADA diabetes program diabetes.[email or web address] If you want to check your blood sugar after meals, keep within a 1 1/2 hr - 2 hour time frame to get the best information. Continue to eat balanced meals and snacks and consider adding more seafood into your diet. Consider ways to bring down your stress because it seems to be affecting your blood sugar. Remember to talk to your doctor about: Metformin, being lightheaded when you stand up   Expected Outcomes:  Demonstrated interest in learning. Expect positive outcomes  Education material provided: blood sugar log  If problems or questions, patient to contact team via:  Phone  Future DSME appointment: PRN

## 2019-03-08 DIAGNOSIS — E139 Other specified diabetes mellitus without complications: Secondary | ICD-10-CM | POA: Diagnosis not present

## 2019-03-29 DIAGNOSIS — Z01812 Encounter for preprocedural laboratory examination: Secondary | ICD-10-CM | POA: Diagnosis not present

## 2019-04-01 DIAGNOSIS — K5730 Diverticulosis of large intestine without perforation or abscess without bleeding: Secondary | ICD-10-CM | POA: Diagnosis not present

## 2019-04-01 DIAGNOSIS — K317 Polyp of stomach and duodenum: Secondary | ICD-10-CM | POA: Diagnosis not present

## 2019-04-01 DIAGNOSIS — D122 Benign neoplasm of ascending colon: Secondary | ICD-10-CM | POA: Diagnosis not present

## 2019-04-01 DIAGNOSIS — K210 Gastro-esophageal reflux disease with esophagitis: Secondary | ICD-10-CM | POA: Diagnosis not present

## 2019-04-01 DIAGNOSIS — Z1211 Encounter for screening for malignant neoplasm of colon: Secondary | ICD-10-CM | POA: Diagnosis not present

## 2019-04-06 DIAGNOSIS — D122 Benign neoplasm of ascending colon: Secondary | ICD-10-CM | POA: Diagnosis not present

## 2019-05-09 DIAGNOSIS — Z23 Encounter for immunization: Secondary | ICD-10-CM | POA: Diagnosis not present

## 2019-06-07 DIAGNOSIS — E139 Other specified diabetes mellitus without complications: Secondary | ICD-10-CM | POA: Diagnosis not present

## 2019-06-07 DIAGNOSIS — Z0000 Encounter for general adult medical examination without abnormal findings: Secondary | ICD-10-CM | POA: Diagnosis not present

## 2019-09-09 ENCOUNTER — Ambulatory Visit: Payer: Medicare Other | Attending: Internal Medicine

## 2019-09-09 DIAGNOSIS — Z23 Encounter for immunization: Secondary | ICD-10-CM | POA: Insufficient documentation

## 2019-09-09 NOTE — Progress Notes (Signed)
   Covid-19 Vaccination Clinic  Name:  [name]    MRN: [id number] DOB: June 25, 1951  09/09/2019  [name] was observed post Covid-19 immunization for 15 minutes without incident. He was provided with Vaccine Information Sheet and instruction to access the V-Safe system.   [name] was instructed to call 911 with any severe reactions post vaccine: Marland Kitchen Difficulty breathing  . Swelling of face and throat  . A fast heartbeat  . A bad rash all over body  . Dizziness and weakness   Immunizations Administered    Name Date Dose VIS Date Route   Pfizer COVID-19 Vaccine 09/09/2019  3:41 PM 0.3 mL 06/18/2019 Intramuscular   Manufacturer: [place]   Lot: [id number]   [place]: [id number]

## 2019-10-06 ENCOUNTER — Ambulatory Visit: Payer: Medicare Other | Attending: Internal Medicine

## 2019-10-06 DIAGNOSIS — Z23 Encounter for immunization: Secondary | ICD-10-CM

## 2019-10-06 NOTE — Progress Notes (Signed)
   Covid-19 Vaccination Clinic  Name:  Jesus Fitzpatrick    MRN: [id number] DOB: [birth year]  10/06/2019  [name] was observed post Covid-19 immunization for 15 minutes without incident. He was provided with Vaccine Information Sheet and instruction to access the V-Safe system.   [name] was instructed to call 911 with any severe reactions post vaccine: Marland Kitchen Difficulty breathing  . Swelling of face and throat  . A fast heartbeat  . A bad rash all over body  . Dizziness and weakness   Immunizations Administered    Name Date Dose VIS Date Route   Pfizer COVID-19 Vaccine 10/06/2019  8:39 AM 0.3 mL 06/18/2019 Intramuscular   Manufacturer: [place]   Lot: [id number]   [place]: [id number]

## 2020-01-19 ENCOUNTER — Other Ambulatory Visit: Payer: Self-pay

## 2020-01-19 ENCOUNTER — Ambulatory Visit (INDEPENDENT_AMBULATORY_CARE_PROVIDER_SITE_OTHER): Payer: Medicare Other | Admitting: Podiatry

## 2020-01-19 ENCOUNTER — Encounter: Payer: Self-pay | Admitting: Podiatry

## 2020-01-19 DIAGNOSIS — E119 Type 2 diabetes mellitus without complications: Secondary | ICD-10-CM

## 2020-01-19 NOTE — Progress Notes (Signed)
This patient presents to the office for evaluation  of  diabetic feet.  He presents to the office for diabetic foot exam.  This patient  says there  is  no pain and discomfort in their feet. .  Patient has no history of infection or drainage from both feet.  . This patient presents  to the office today for  a foot evaluation due to history of  Diabetes.  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    General Appearance  Alert, conversant and in no acute stress.  Vascular  Dorsalis pedis and posterior tibial  pulses are palpable  bilaterally.  Capillary return is within normal limits  bilaterally. Temperature is within normal limits  bilaterally.  Neurologic  Senn-Weinstein monofilament wire test within normal limits  bilaterally. Muscle power within normal limits bilaterally.  Nails Normal nails  Both feet  B/L  Orthopedic  No limitations of motion of motion feet .  No crepitus or effusions noted.  No bony pathology or digital deformities noted. Ganglion anterior aspect right hallux.  Hammer toes 2,3  B/l.  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 Skin  normotropic skin with no porokeratosis noted bilaterally.  No signs of infections or ulcers noted.     Onychomycosis  Diabetes with no foot complications  ROV     A diabetic foot exam was performed and there is no evidence of any vascular or neurologic pathology.   RTC 1   year.   Gardiner Barefoot DPM

## 2020-12-18 IMAGING — DX PORTABLE CHEST - 1 VIEW
1 series · 2 of 2 positions shown · non-contrast
Comparison: May 27, 2015.

CLINICAL DATA: Blurred vision and dizziness.  Diabetic ketoacidosis

EXAM:
PORTABLE CHEST 1 VIEW

[Series 1: chest ap · 0.14mm/px · 2 of 2 slices shown]
[im 1/2]
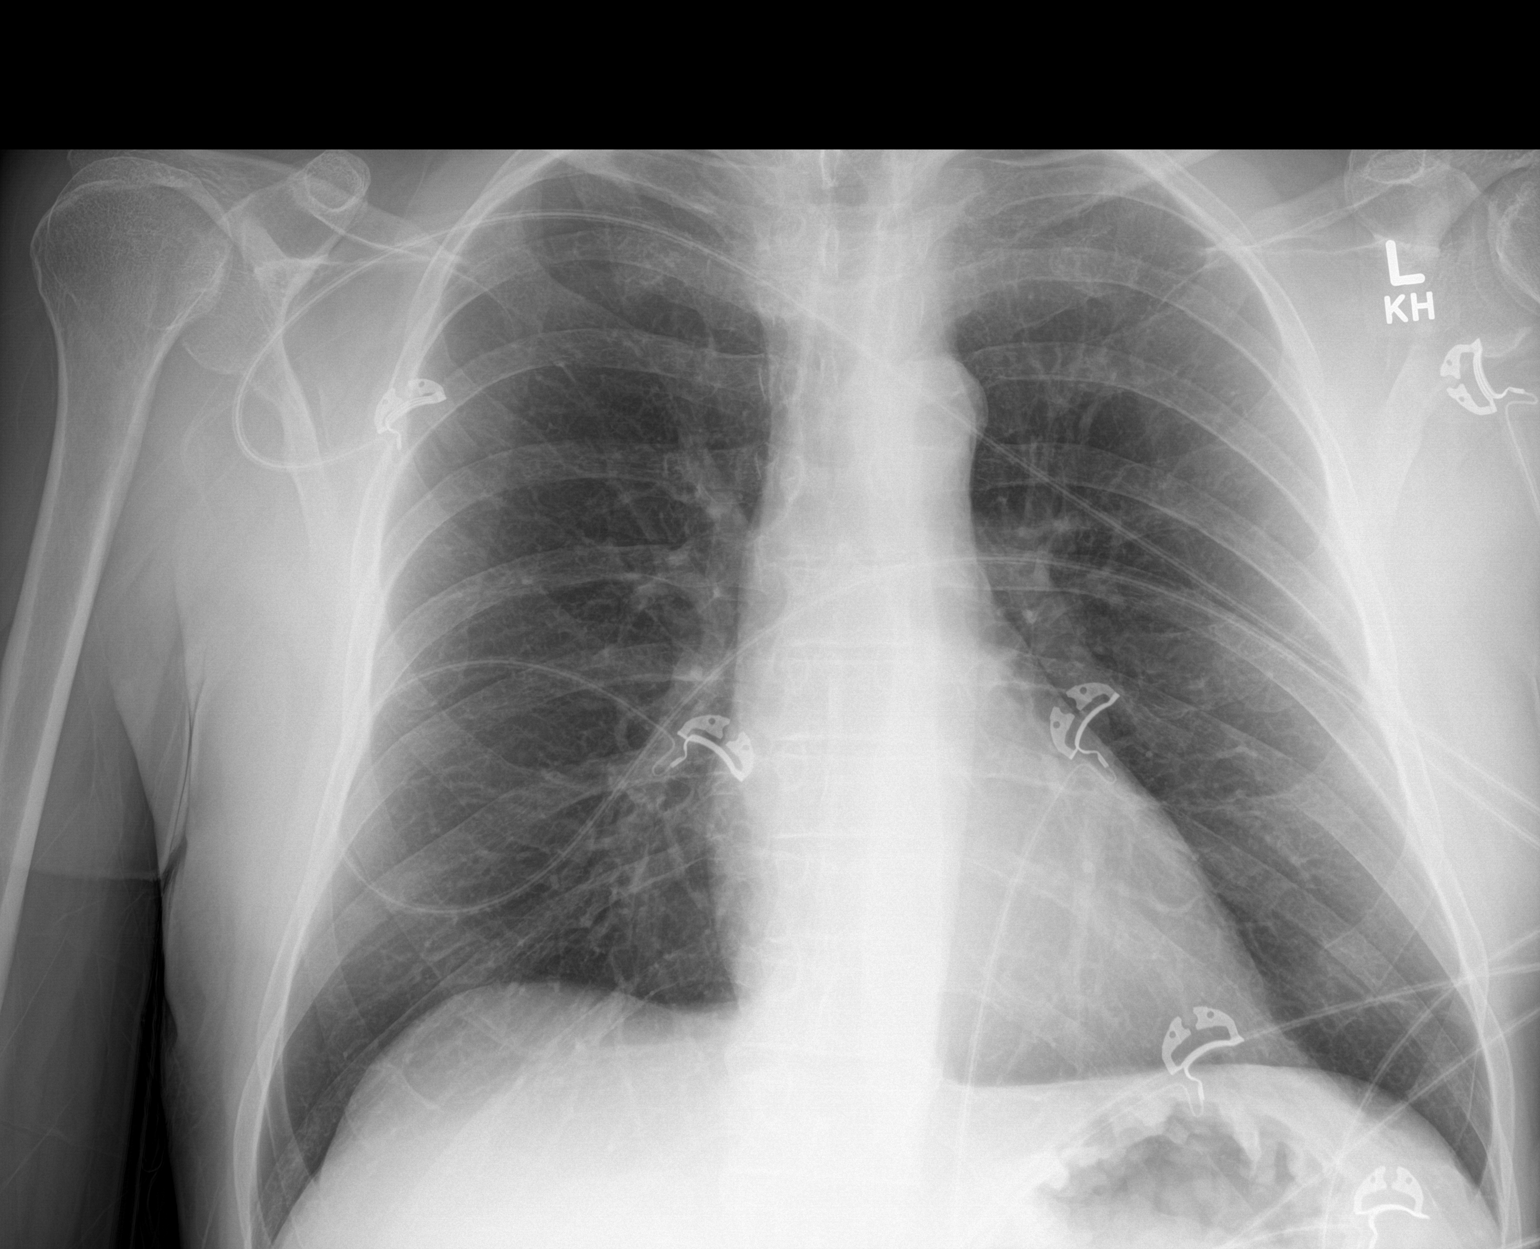
[im 2/2]
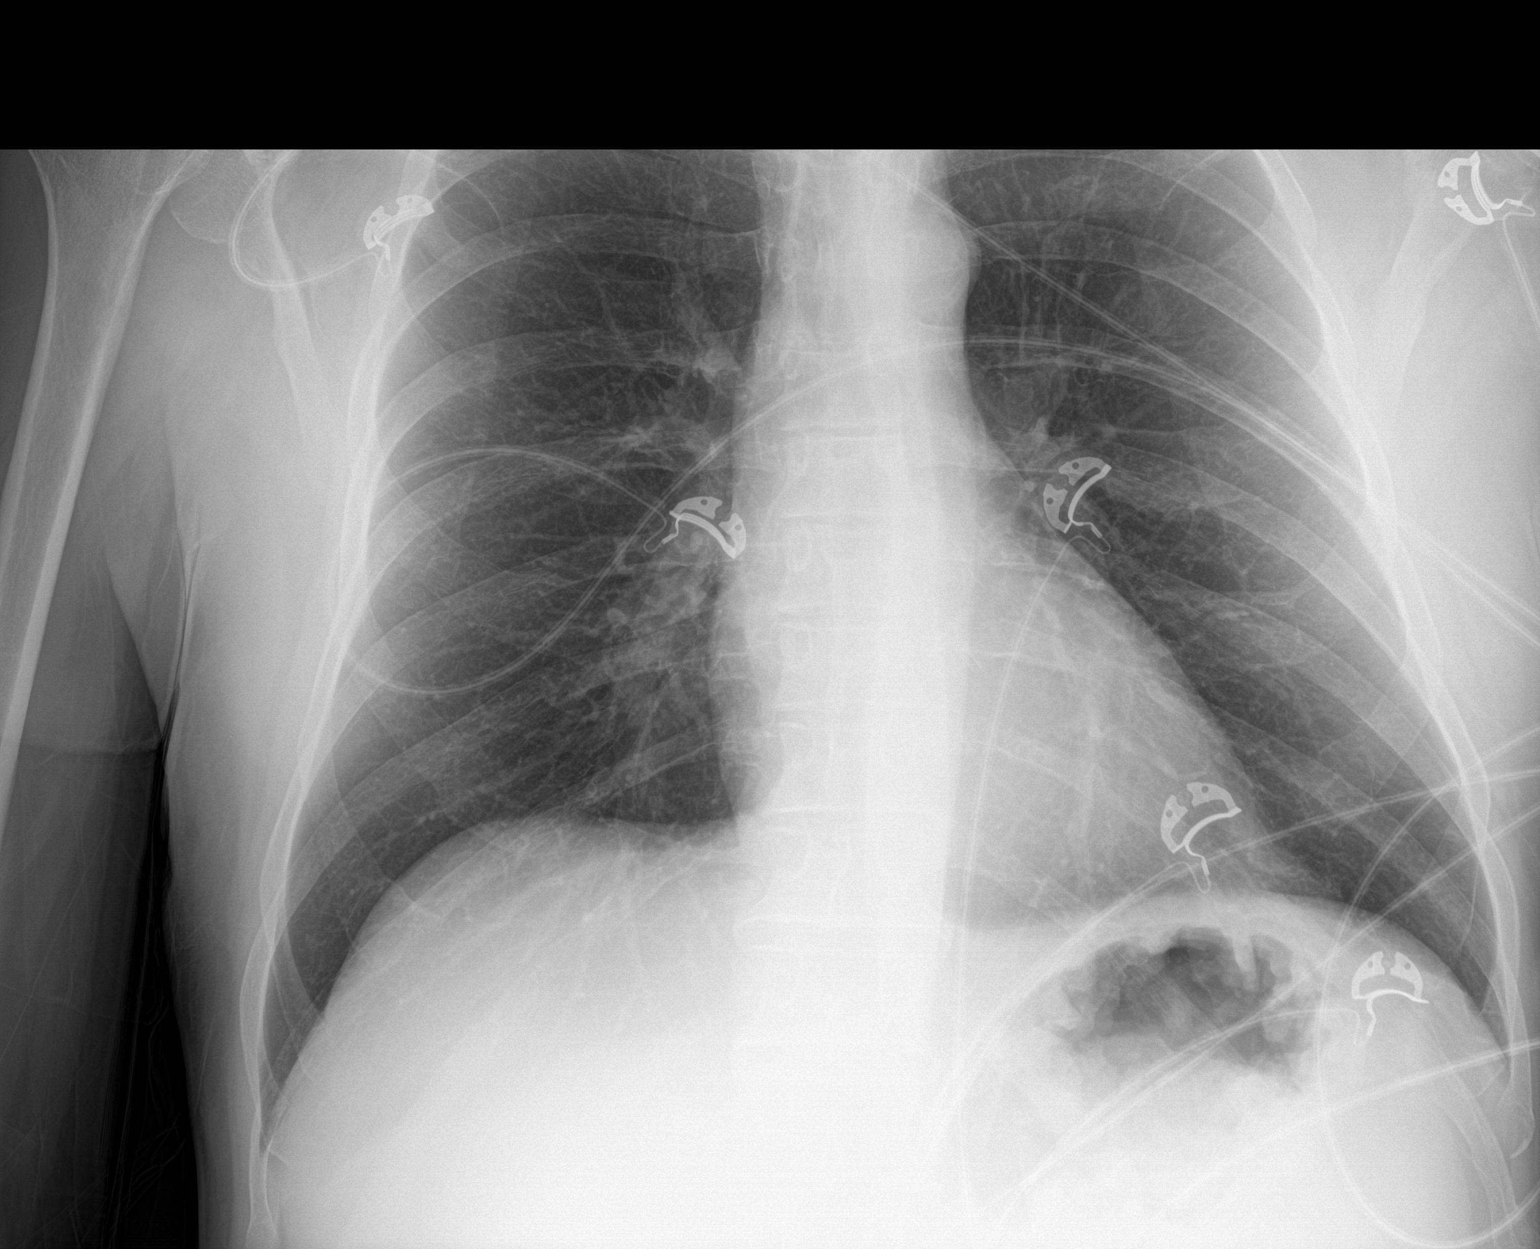

[2 of 2 positions shown; findings below may reference images not displayed]

FINDINGS: No edema or consolidation. Heart size and pulmonary vascularity are
within normal limits. No adenopathy. No bone lesions.
IMPRESSION: No edema or consolidation.

## 2021-01-19 ENCOUNTER — Encounter: Payer: Self-pay | Admitting: Podiatry

## 2021-01-19 ENCOUNTER — Other Ambulatory Visit: Payer: Self-pay

## 2021-01-19 ENCOUNTER — Ambulatory Visit: Payer: Medicare Other | Admitting: Podiatry

## 2021-01-19 DIAGNOSIS — E109 Type 1 diabetes mellitus without complications: Secondary | ICD-10-CM | POA: Diagnosis not present

## 2021-01-19 DIAGNOSIS — E119 Type 2 diabetes mellitus without complications: Secondary | ICD-10-CM | POA: Diagnosis not present

## 2021-01-19 NOTE — Progress Notes (Signed)
This patient presents to the office for evaluation  of  diabetic feet.  He presents to the office for diabetic foot exam.  This patient  says there  is  no pain and discomfort in their feet. .  Patient has no history of infection or drainage from both feet.  . This patient presents  to the office today for  a foot evaluation due to history of  Diabetes.  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    General Appearance  Alert, conversant and in no acute stress.  Vascular  Dorsalis pedis and posterior tibial  pulses are palpable  bilaterally.  Capillary return is within normal limits  bilaterally. Temperature is within normal limits  bilaterally.  Neurologic  Senn-Weinstein monofilament wire test within normal limits  bilaterally. Muscle power within normal limits bilaterally.  Nails Normal nails  Both feet  B/L  Orthopedic  No limitations of motion of motion feet .  No crepitus or effusions noted.  No bony pathology or digital deformities noted. Ganglion anterior aspect right ankle.  Hammer toes 2,3  B/l.  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  Underlapping fourth toe left foot. Skin  normotropic skin with no porokeratosis noted bilaterally.  No signs of infections or ulcers noted.     Onychomycosis  Diabetes with no foot complications  ROV     A diabetic foot exam was performed and there is no evidence of any vascular or neurologic pathology.   RTC 1   year.   Radiah Lubinski DPM  

## 2021-01-22 DIAGNOSIS — E119 Type 2 diabetes mellitus without complications: Secondary | ICD-10-CM | POA: Diagnosis not present

## 2021-01-22 DIAGNOSIS — Z794 Long term (current) use of insulin: Secondary | ICD-10-CM | POA: Diagnosis not present

## 2021-01-22 DIAGNOSIS — H0102A Squamous blepharitis right eye, upper and lower eyelids: Secondary | ICD-10-CM | POA: Diagnosis not present

## 2021-01-22 DIAGNOSIS — H0102B Squamous blepharitis left eye, upper and lower eyelids: Secondary | ICD-10-CM | POA: Diagnosis not present

## 2021-01-22 DIAGNOSIS — H40013 Open angle with borderline findings, low risk, bilateral: Secondary | ICD-10-CM | POA: Diagnosis not present

## 2021-01-22 DIAGNOSIS — H2513 Age-related nuclear cataract, bilateral: Secondary | ICD-10-CM | POA: Diagnosis not present

## 2021-04-10 ENCOUNTER — Telehealth: Payer: Self-pay | Admitting: Podiatry

## 2021-04-10 NOTE — Telephone Encounter (Signed)
Called patient Jesus Fitzpatrick to advise that his 01/24/22 appt needed to be rescheduled. Moved appt to 7/26 did advise patient that if that did not work to please give [place] a call

## 2021-06-22 DIAGNOSIS — Z1211 Encounter for screening for malignant neoplasm of colon: Secondary | ICD-10-CM | POA: Diagnosis not present

## 2021-06-22 DIAGNOSIS — Z0000 Encounter for general adult medical examination without abnormal findings: Secondary | ICD-10-CM | POA: Diagnosis not present

## 2021-06-22 DIAGNOSIS — Z794 Long term (current) use of insulin: Secondary | ICD-10-CM | POA: Diagnosis not present

## 2021-06-22 DIAGNOSIS — I10 Essential (primary) hypertension: Secondary | ICD-10-CM | POA: Diagnosis not present

## 2021-06-22 DIAGNOSIS — Z1159 Encounter for screening for other viral diseases: Secondary | ICD-10-CM | POA: Diagnosis not present

## 2021-06-22 DIAGNOSIS — E119 Type 2 diabetes mellitus without complications: Secondary | ICD-10-CM | POA: Diagnosis not present

## 2021-06-22 DIAGNOSIS — Z136 Encounter for screening for cardiovascular disorders: Secondary | ICD-10-CM | POA: Diagnosis not present

## 2021-08-02 DIAGNOSIS — H903 Sensorineural hearing loss, bilateral: Secondary | ICD-10-CM | POA: Diagnosis not present

## 2021-08-29 DIAGNOSIS — H905 Unspecified sensorineural hearing loss: Secondary | ICD-10-CM | POA: Diagnosis not present

## 2021-10-08 DIAGNOSIS — R350 Frequency of micturition: Secondary | ICD-10-CM | POA: Diagnosis not present

## 2021-12-21 DIAGNOSIS — E1169 Type 2 diabetes mellitus with other specified complication: Secondary | ICD-10-CM | POA: Diagnosis not present

## 2021-12-21 DIAGNOSIS — I10 Essential (primary) hypertension: Secondary | ICD-10-CM | POA: Diagnosis not present

## 2021-12-21 DIAGNOSIS — E785 Hyperlipidemia, unspecified: Secondary | ICD-10-CM | POA: Diagnosis not present

## 2021-12-21 DIAGNOSIS — Z794 Long term (current) use of insulin: Secondary | ICD-10-CM | POA: Diagnosis not present

## 2022-01-14 DIAGNOSIS — E1169 Type 2 diabetes mellitus with other specified complication: Secondary | ICD-10-CM | POA: Diagnosis not present

## 2022-01-14 DIAGNOSIS — Z794 Long term (current) use of insulin: Secondary | ICD-10-CM | POA: Diagnosis not present

## 2022-01-14 DIAGNOSIS — I10 Essential (primary) hypertension: Secondary | ICD-10-CM | POA: Diagnosis not present

## 2022-01-14 DIAGNOSIS — E785 Hyperlipidemia, unspecified: Secondary | ICD-10-CM | POA: Diagnosis not present

## 2022-01-14 DIAGNOSIS — K219 Gastro-esophageal reflux disease without esophagitis: Secondary | ICD-10-CM | POA: Diagnosis not present

## 2022-01-24 ENCOUNTER — Ambulatory Visit: Payer: Medicare Other | Admitting: Podiatry

## 2022-01-28 ENCOUNTER — Ambulatory Visit: Payer: Medicare Other | Admitting: Podiatry

## 2022-01-28 DIAGNOSIS — E119 Type 2 diabetes mellitus without complications: Secondary | ICD-10-CM

## 2022-01-28 NOTE — Progress Notes (Signed)
This patient presents to the office for evaluation  of  diabetic feet.  He presents to the office for diabetic foot exam.  This patient  says there  is  no pain and discomfort in their feet. .  Patient has no history of infection or drainage from both feet.  . This patient presents  to the office today for  a foot evaluation due to history of  Diabetes.  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    General Appearance  Alert, conversant and in no acute stress.  Vascular  Dorsalis pedis and posterior tibial  pulses are palpable  bilaterally.  Capillary return is within normal limits  bilaterally. Temperature is within normal limits  bilaterally.  Neurologic  Senn-Weinstein monofilament wire test within normal limits  bilaterally. Muscle power within normal limits bilaterally.  Nails Normal nails  Both feet  B/L  Orthopedic  No limitations of motion of motion feet .  No crepitus or effusions noted.  No bony pathology or digital deformities noted. Ganglion anterior aspect right ankle.  Hammer toes 2,3  B/l.  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  Underlapping fourth toe left foot. Skin  normotropic skin with no porokeratosis noted bilaterally.  No signs of infections or ulcers noted.     Onychomycosis  Diabetes with no foot complications  ROV     A diabetic foot exam was performed and there is no evidence of any vascular or neurologic pathology.   RTC 1   year.   Gardiner Barefoot DPM

## 2022-01-30 ENCOUNTER — Ambulatory Visit: Payer: Medicare Other | Admitting: Podiatry

## 2022-03-25 DIAGNOSIS — E119 Type 2 diabetes mellitus without complications: Secondary | ICD-10-CM | POA: Diagnosis not present

## 2022-03-25 DIAGNOSIS — H0014 Chalazion left upper eyelid: Secondary | ICD-10-CM | POA: Diagnosis not present

## 2022-03-25 DIAGNOSIS — Z794 Long term (current) use of insulin: Secondary | ICD-10-CM | POA: Diagnosis not present

## 2022-03-25 DIAGNOSIS — H0102A Squamous blepharitis right eye, upper and lower eyelids: Secondary | ICD-10-CM | POA: Diagnosis not present

## 2022-03-25 DIAGNOSIS — H2513 Age-related nuclear cataract, bilateral: Secondary | ICD-10-CM | POA: Diagnosis not present

## 2022-03-25 DIAGNOSIS — H40013 Open angle with borderline findings, low risk, bilateral: Secondary | ICD-10-CM | POA: Diagnosis not present

## 2022-03-25 DIAGNOSIS — H0102B Squamous blepharitis left eye, upper and lower eyelids: Secondary | ICD-10-CM | POA: Diagnosis not present

## 2022-04-15 DIAGNOSIS — R350 Frequency of micturition: Secondary | ICD-10-CM | POA: Diagnosis not present

## 2022-04-15 DIAGNOSIS — R3914 Feeling of incomplete bladder emptying: Secondary | ICD-10-CM | POA: Diagnosis not present

## 2022-06-10 DIAGNOSIS — E785 Hyperlipidemia, unspecified: Secondary | ICD-10-CM | POA: Diagnosis not present

## 2022-06-10 DIAGNOSIS — K219 Gastro-esophageal reflux disease without esophagitis: Secondary | ICD-10-CM | POA: Diagnosis not present

## 2022-06-10 DIAGNOSIS — E1165 Type 2 diabetes mellitus with hyperglycemia: Secondary | ICD-10-CM | POA: Diagnosis not present

## 2022-06-10 DIAGNOSIS — I10 Essential (primary) hypertension: Secondary | ICD-10-CM | POA: Diagnosis not present

## 2022-06-10 DIAGNOSIS — Z0000 Encounter for general adult medical examination without abnormal findings: Secondary | ICD-10-CM | POA: Diagnosis not present

## 2022-06-10 DIAGNOSIS — Z794 Long term (current) use of insulin: Secondary | ICD-10-CM | POA: Diagnosis not present

## 2023-01-27 ENCOUNTER — Encounter: Payer: Self-pay | Admitting: Podiatry

## 2023-01-27 ENCOUNTER — Ambulatory Visit: Payer: Medicare Other | Admitting: Podiatry

## 2023-01-27 DIAGNOSIS — E119 Type 2 diabetes mellitus without complications: Secondary | ICD-10-CM | POA: Diagnosis not present

## 2023-01-27 NOTE — Progress Notes (Signed)
This patient presents to the office for evaluation  of  diabetic feet.  He presents to the office for diabetic foot exam.  This patient  says there  is  no pain and discomfort in their feet. .  Patient has no history of infection or drainage from both feet.  . This patient presents  to the office today for  a foot evaluation due to history of  Diabetes.  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    General Appearance  Alert, conversant and in no acute stress.  Vascular  Dorsalis pedis and posterior tibial  pulses are palpable  bilaterally.  Capillary return is within normal limits  bilaterally. Temperature is within normal limits  bilaterally.  Neurologic  Senn-Weinstein monofilament wire test within normal limits  bilaterally. Muscle power within normal limits bilaterally.  Nails Normal nails  Both feet  B/L  Orthopedic  No limitations of motion of motion feet .  No crepitus or effusions noted.  No bony pathology or digital deformities noted. Ganglion anterior aspect right ankle.  Hammer toes 2,3  B/l.  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  Underlapping fourth toe left foot.  Skin  normotropic skin with no porokeratosis noted bilaterally.  No signs of infections or ulcers noted.     Diabetes with no foot complications  ROV     A diabetic foot exam was performed and there is no evidence of any vascular or neurologic pathology.   RTC 1   year.   Helane Gunther DPM

## 2023-03-31 DIAGNOSIS — H0102A Squamous blepharitis right eye, upper and lower eyelids: Secondary | ICD-10-CM | POA: Diagnosis not present

## 2023-03-31 DIAGNOSIS — E119 Type 2 diabetes mellitus without complications: Secondary | ICD-10-CM | POA: Diagnosis not present

## 2023-03-31 DIAGNOSIS — Z794 Long term (current) use of insulin: Secondary | ICD-10-CM | POA: Diagnosis not present

## 2023-03-31 DIAGNOSIS — H0102B Squamous blepharitis left eye, upper and lower eyelids: Secondary | ICD-10-CM | POA: Diagnosis not present

## 2023-03-31 DIAGNOSIS — H2513 Age-related nuclear cataract, bilateral: Secondary | ICD-10-CM | POA: Diagnosis not present

## 2023-06-16 DIAGNOSIS — E1169 Type 2 diabetes mellitus with other specified complication: Secondary | ICD-10-CM | POA: Diagnosis not present

## 2023-06-16 DIAGNOSIS — E1165 Type 2 diabetes mellitus with hyperglycemia: Secondary | ICD-10-CM | POA: Diagnosis not present

## 2023-06-16 DIAGNOSIS — I10 Essential (primary) hypertension: Secondary | ICD-10-CM | POA: Diagnosis not present

## 2023-06-16 DIAGNOSIS — E785 Hyperlipidemia, unspecified: Secondary | ICD-10-CM | POA: Diagnosis not present

## 2023-06-16 DIAGNOSIS — Z1211 Encounter for screening for malignant neoplasm of colon: Secondary | ICD-10-CM | POA: Diagnosis not present

## 2023-06-16 DIAGNOSIS — Z0000 Encounter for general adult medical examination without abnormal findings: Secondary | ICD-10-CM | POA: Diagnosis not present

## 2023-06-16 DIAGNOSIS — Z23 Encounter for immunization: Secondary | ICD-10-CM | POA: Diagnosis not present

## 2023-12-06 DIAGNOSIS — E785 Hyperlipidemia, unspecified: Secondary | ICD-10-CM | POA: Diagnosis not present

## 2023-12-06 DIAGNOSIS — I10 Essential (primary) hypertension: Secondary | ICD-10-CM | POA: Diagnosis not present

## 2023-12-06 DIAGNOSIS — E1165 Type 2 diabetes mellitus with hyperglycemia: Secondary | ICD-10-CM | POA: Diagnosis not present

## 2023-12-06 DIAGNOSIS — E1169 Type 2 diabetes mellitus with other specified complication: Secondary | ICD-10-CM | POA: Diagnosis not present

## 2023-12-15 DIAGNOSIS — Z136 Encounter for screening for cardiovascular disorders: Secondary | ICD-10-CM | POA: Diagnosis not present

## 2023-12-15 DIAGNOSIS — I10 Essential (primary) hypertension: Secondary | ICD-10-CM | POA: Diagnosis not present

## 2023-12-15 DIAGNOSIS — Z0000 Encounter for general adult medical examination without abnormal findings: Secondary | ICD-10-CM | POA: Diagnosis not present

## 2023-12-15 DIAGNOSIS — E785 Hyperlipidemia, unspecified: Secondary | ICD-10-CM | POA: Diagnosis not present

## 2023-12-15 DIAGNOSIS — E119 Type 2 diabetes mellitus without complications: Secondary | ICD-10-CM | POA: Diagnosis not present

## 2023-12-15 DIAGNOSIS — K219 Gastro-esophageal reflux disease without esophagitis: Secondary | ICD-10-CM | POA: Diagnosis not present

## 2023-12-15 DIAGNOSIS — K2100 Gastro-esophageal reflux disease with esophagitis, without bleeding: Secondary | ICD-10-CM | POA: Diagnosis not present

## 2024-01-05 DIAGNOSIS — E1169 Type 2 diabetes mellitus with other specified complication: Secondary | ICD-10-CM | POA: Diagnosis not present

## 2024-01-05 DIAGNOSIS — E785 Hyperlipidemia, unspecified: Secondary | ICD-10-CM | POA: Diagnosis not present

## 2024-01-05 DIAGNOSIS — E1165 Type 2 diabetes mellitus with hyperglycemia: Secondary | ICD-10-CM | POA: Diagnosis not present

## 2024-01-05 DIAGNOSIS — I10 Essential (primary) hypertension: Secondary | ICD-10-CM | POA: Diagnosis not present

## 2024-02-02 ENCOUNTER — Ambulatory Visit: Payer: Medicare Other | Admitting: Podiatry

## 2024-02-05 ENCOUNTER — Ambulatory Visit: Admitting: Podiatry

## 2024-02-05 ENCOUNTER — Encounter: Payer: Self-pay | Admitting: Podiatry

## 2024-02-05 DIAGNOSIS — E119 Type 2 diabetes mellitus without complications: Secondary | ICD-10-CM

## 2024-02-05 NOTE — Progress Notes (Signed)
This patient presents to the office for evaluation  of  diabetic feet.  He presents to the office for diabetic foot exam.  This patient  says there  is  no pain and discomfort in their feet. .  Patient has no history of infection or drainage from both feet.  . This patient presents  to the office today for  a foot evaluation due to history of  Diabetes.  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    General Appearance  Alert, conversant and in no acute stress.  Vascular  Dorsalis pedis and posterior tibial  pulses are palpable  bilaterally.  Capillary return is within normal limits  bilaterally. Temperature is within normal limits  bilaterally.  Neurologic  Senn-Weinstein monofilament wire test within normal limits  bilaterally. Muscle power within normal limits bilaterally.  Nails Normal nails  Both feet  B/L  Orthopedic  No limitations of motion of motion feet .  No crepitus or effusions noted.  No bony pathology or digital deformities noted. Ganglion anterior aspect right ankle.  Hammer toes 2,3  B/l.  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  Underlapping fourth toe left foot.  Skin  normotropic skin with no porokeratosis noted bilaterally.  No signs of infections or ulcers noted.     Diabetes with no foot complications  ROV     A diabetic foot exam was performed and there is no evidence of any vascular or neurologic pathology.   RTC 1   year.   Helane Gunther DPM

## 2024-03-07 DIAGNOSIS — I10 Essential (primary) hypertension: Secondary | ICD-10-CM | POA: Diagnosis not present

## 2024-03-07 DIAGNOSIS — E1169 Type 2 diabetes mellitus with other specified complication: Secondary | ICD-10-CM | POA: Diagnosis not present

## 2024-03-07 DIAGNOSIS — E785 Hyperlipidemia, unspecified: Secondary | ICD-10-CM | POA: Diagnosis not present

## 2024-03-07 DIAGNOSIS — E1165 Type 2 diabetes mellitus with hyperglycemia: Secondary | ICD-10-CM | POA: Diagnosis not present

## 2024-04-06 DIAGNOSIS — E785 Hyperlipidemia, unspecified: Secondary | ICD-10-CM | POA: Diagnosis not present

## 2024-04-06 DIAGNOSIS — E1169 Type 2 diabetes mellitus with other specified complication: Secondary | ICD-10-CM | POA: Diagnosis not present

## 2024-04-06 DIAGNOSIS — E1165 Type 2 diabetes mellitus with hyperglycemia: Secondary | ICD-10-CM | POA: Diagnosis not present

## 2024-04-06 DIAGNOSIS — I10 Essential (primary) hypertension: Secondary | ICD-10-CM | POA: Diagnosis not present

## 2024-04-12 DIAGNOSIS — H2513 Age-related nuclear cataract, bilateral: Secondary | ICD-10-CM | POA: Diagnosis not present

## 2024-04-12 DIAGNOSIS — H0102B Squamous blepharitis left eye, upper and lower eyelids: Secondary | ICD-10-CM | POA: Diagnosis not present

## 2024-04-12 DIAGNOSIS — H43811 Vitreous degeneration, right eye: Secondary | ICD-10-CM | POA: Diagnosis not present

## 2024-04-12 DIAGNOSIS — Z794 Long term (current) use of insulin: Secondary | ICD-10-CM | POA: Diagnosis not present

## 2024-04-12 DIAGNOSIS — E119 Type 2 diabetes mellitus without complications: Secondary | ICD-10-CM | POA: Diagnosis not present

## 2024-04-12 DIAGNOSIS — H0102A Squamous blepharitis right eye, upper and lower eyelids: Secondary | ICD-10-CM | POA: Diagnosis not present

## 2025-02-03 ENCOUNTER — Ambulatory Visit: Admitting: Podiatry
